# Patient Record
Sex: Male | Born: 1990 | Race: White | Hispanic: No | Marital: Single | Smoking: Former smoker
Health system: Southern US, Community
[De-identification: ages and names within clinical notes are randomized; demographics above are authoritative.]

## PROBLEM LIST (undated history)

## (undated) DIAGNOSIS — F32A Depression, unspecified: Secondary | ICD-10-CM

## (undated) DIAGNOSIS — F329 Major depressive disorder, single episode, unspecified: Secondary | ICD-10-CM

## (undated) DIAGNOSIS — F988 Other specified behavioral and emotional disorders with onset usually occurring in childhood and adolescence: Secondary | ICD-10-CM

## (undated) DIAGNOSIS — L659 Nonscarring hair loss, unspecified: Secondary | ICD-10-CM

## (undated) DIAGNOSIS — F410 Panic disorder [episodic paroxysmal anxiety] without agoraphobia: Secondary | ICD-10-CM

## (undated) DIAGNOSIS — G8929 Other chronic pain: Secondary | ICD-10-CM

## (undated) DIAGNOSIS — F419 Anxiety disorder, unspecified: Secondary | ICD-10-CM

## (undated) DIAGNOSIS — R079 Chest pain, unspecified: Secondary | ICD-10-CM

---

## 2009-04-27 ENCOUNTER — Emergency Department: Payer: Self-pay | Admitting: Emergency Medicine

## 2016-04-04 ENCOUNTER — Encounter (HOSPITAL_COMMUNITY): Payer: Self-pay | Admitting: Licensed Clinical Social Worker

## 2016-04-04 ENCOUNTER — Ambulatory Visit (INDEPENDENT_AMBULATORY_CARE_PROVIDER_SITE_OTHER): Payer: 59 | Admitting: Licensed Clinical Social Worker

## 2016-04-04 ENCOUNTER — Encounter (INDEPENDENT_AMBULATORY_CARE_PROVIDER_SITE_OTHER): Payer: Self-pay

## 2016-04-04 DIAGNOSIS — F332 Major depressive disorder, recurrent severe without psychotic features: Secondary | ICD-10-CM | POA: Diagnosis not present
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
## 2016-04-11 ENCOUNTER — Other Ambulatory Visit (HOSPITAL_COMMUNITY): Payer: 59 | Attending: Psychiatry | Admitting: Licensed Clinical Social Worker

## 2016-04-11 DIAGNOSIS — F332 Major depressive disorder, recurrent severe without psychotic features: Secondary | ICD-10-CM | POA: Diagnosis not present

## 2016-04-12 ENCOUNTER — Other Ambulatory Visit (HOSPITAL_COMMUNITY): Payer: 59 | Admitting: Licensed Clinical Social Worker

## 2016-04-12 ENCOUNTER — Other Ambulatory Visit (HOSPITAL_COMMUNITY): Payer: 59 | Attending: Psychiatry | Admitting: Licensed Clinical Social Worker

## 2016-04-12 ENCOUNTER — Other Ambulatory Visit (HOSPITAL_COMMUNITY): Payer: 59 | Admitting: Psychiatry

## 2016-04-12 ENCOUNTER — Encounter (HOSPITAL_COMMUNITY): Payer: Self-pay | Admitting: Occupational Therapy

## 2016-04-12 DIAGNOSIS — F332 Major depressive disorder, recurrent severe without psychotic features: Secondary | ICD-10-CM | POA: Diagnosis not present
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
## 2016-04-12 NOTE — Psych (Signed)
   [facility]  Logan EaringJohn Godsey [id number]  Session Time: 9 AM - 2 PM  Participation Level: Active  Behavioral Response: CasualAlertDepressed  Type of Therapy: Group Therapy  Treatment Goals addressed: Coping; Depression  Interventions: CBT, DBT, Supportive and Reframing  Summary: Clinician facilitated check-in regarding current stressors and situation, and review of patient completed diary card. Clinician utilized active listening and empathetic response and validated patient emotions. Clinician facilitated discussion on racing thoughts, control, radical acceptance, and how to utilize skills. Clinician introduced topic of communication and provided psycho-education on non-verbal cues and the 4 types of communication styles. Clinician assessed for immediate needs, medication compliance and efficacy, and safety concerns.   Suicidal/Homicidal: Nowithout intent/plan  Therapist Response: Logan Juarez is a 25 y.o. male who presents with depression symptoms. Patient arrived within time allowed and reports he is feeling "okay." Patient rates his mood at a 6 on a 1- 10 scale with 10 being great.  Patient engaged in group activity and discussion. Patient reported understanding of topics discussed. Patient demonstrates some progress as evidenced by openness and some insight. This is patient's first day of group. Patient denies SI/HI/self-harm thoughts.    Plan: Patient will continue in PHP and medication management. Work towards decreasing depression symptoms and increase emotional regulation and positive coping skills.    Diagnosis: Severe episode of recurrent major depressive disorder, without psychotic features (HCC) [F33.2]    1. Severe episode of recurrent major depressive disorder, without psychotic features (HCC)       Donia GuilesJenny Arora Coakley, LCSW 04/12/2016

## 2016-04-13 ENCOUNTER — Encounter (HOSPITAL_COMMUNITY): Payer: Self-pay | Admitting: Psychiatry

## 2016-04-13 ENCOUNTER — Other Ambulatory Visit (HOSPITAL_COMMUNITY): Payer: Self-pay

## 2016-04-13 MED ORDER — CITALOPRAM HYDROBROMIDE 10 MG PO TABS: 10.0000 mg | ORAL_TABLET | Freq: Every day | ORAL | 0 refills | Status: DC

## 2016-04-13 NOTE — Psych (Signed)
   [facility]  Logan Juarez [id number]  Session Time: 9 AM - 2 PM  Participation Level: Active  Behavioral Response: CasualAlertDepressed  Type of Therapy: Group Therapy  Treatment Goals addressed: Coping; Depression  Interventions: CBT, DBT, Supportive and Reframing  Summary: Clinician facilitated check-in regarding current stressors and situation, and review of patient completed diary card. Clinician utilized active listening and empathetic response and validated patient emotions. Clinician facilitated discussion on the way that our past effects our present, forgiveness, and mindfulness. Clinician reviewed topic of communication and introduced "I Statements" and how to utilize them to increase effective communication.. Clinician assessed for immediate needs, medication compliance and efficacy, and safety concerns.   Suicidal/Homicidal: Nowithout intent/plan  Therapist Response: Logan EaringJohn Mccallum is a 25 y.o. male who presents with depression symptoms. Patient arrived within time allowed and reports he is feeling "fine." Patient rates his mood at a 6 on a 1- 10 scale with 10 being great.  Patient engaged in group activity and discussion. Patient reports understanding of topics discussed however showed some resistance to the effectiveness of using the skill. Patient demonstrates some progress as evidenced by openness and engagement. Patient denies SI/HI/self-harm thoughts.    Plan: Patient will continue in PHP and medication management. Work towards decreasing depression symptoms and increase emotional regulation and positive coping skills.    Diagnosis: Severe episode of recurrent major depressive disorder, without psychotic features (HCC) [F33.2]    1. Severe episode of recurrent major depressive disorder, without psychotic features (HCC)       Donia GuilesJenny Dearion Huot, LCSW 04/13/2016
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
## 2016-04-14 ENCOUNTER — Other Ambulatory Visit (HOSPITAL_COMMUNITY): Payer: 59 | Admitting: Occupational Therapy

## 2016-04-14 ENCOUNTER — Encounter (HOSPITAL_COMMUNITY): Payer: Self-pay | Admitting: Occupational Therapy

## 2016-04-14 ENCOUNTER — Other Ambulatory Visit (HOSPITAL_COMMUNITY): Payer: 59 | Attending: Psychiatry | Admitting: Licensed Clinical Social Worker

## 2016-04-14 DIAGNOSIS — F329 Major depressive disorder, single episode, unspecified: Secondary | ICD-10-CM | POA: Insufficient documentation

## 2016-04-14 DIAGNOSIS — F332 Major depressive disorder, recurrent severe without psychotic features: Secondary | ICD-10-CM

## 2016-04-14 DIAGNOSIS — F410 Panic disorder [episodic paroxysmal anxiety] without agoraphobia: Secondary | ICD-10-CM | POA: Diagnosis not present

## 2016-04-14 NOTE — Therapy (Signed)
[facility] [address] [place], [place], [address] Phone: [phone]   Fax:  [phone]  Occupational Therapy Treatment  Patient Details  Name: Logan Juarez MRN: [id number] Date of Birth: May 21, 1991 Referring Provider: Dr. Nehemiah MassedFernando Cobos  Encounter Date: 04/14/2016      OT End of Session - 04/14/16 1712    Visit Number 2   Number of Visits 6   Date for OT Re-Evaluation 05/03/16   Authorization Type BCBS   OT Start Time 1030   OT Stop Time 1130   OT Time Calculation (min) 60 min   Activity Tolerance Patient tolerated treatment well   Behavior During Therapy [facility] for tasks assessed/performed      History reviewed. No pertinent past medical history.  No past surgical history on file.  There were no vitals filed for this visit.      Subjective Assessment - 04/14/16 1712    Currently in Pain? No/denies            [facility] - 04/14/16 1711      Assessment   Diagnosis Depression and anxiety     Precautions   Precautions None      OT Group: Social and Communication Skills   S:  "I feel like I have pretty good communication skills." O:  Patient actively participated in the following skilled occupational therapy treatment session this date:             Social and communication skills: Rohith participated in discussion of social and communication skills including the importance of social skills, what he is good at and what he needs to improve on. Fisher engaged in discussion on conversation starters identifying more than 5  potential ideas for beginning a conversation. Also engaged in discussion on body language and  what is portrayed via various gestures and postures. Montrey completed self-esteem alphabet  worksheet identifying good qualities about himself. Also discussed variable that affect self-esteem. Finally engaged in discussion of assertiveness including confident behavior and reviewed the traits of  passive, assertive, and aggressive individuals.  A:  Patient participated in skilled occupational therapy group for social and communication skills this date.  Patient was engaged and open to ideas and strategies introduced.  Pt feels confident in his communication skills, however admits to very low self-esteem and minimal desire to improve his communication skills for future interactions.   P:  Continue participation in skilled occupational therapy groups  1-2 times per week for 2 weeks in order to gain the necessary skills needed to return to full time community living and learn effective coping strategies to be a productive community resident. Follow up on HEP social and communication skills.           OT Short Term Goals - 04/14/16 1713      OT SHORT TERM GOAL #1   Title Patient will be educated on strategies to improve psychosocial skills needed to participate fully in all daily, work, and leisure activities.   Time 3   Period Weeks   Status On-going     OT SHORT TERM GOAL #2   Title Patient will be educated on a HEP and independent with implementation of HEP.   Time 3   Period Weeks   Status On-going     OT SHORT TERM GOAL #3   Title Patient will independently apply psychosocial skills and coping mechanisms to her daily activities in order to function independently   Time 3   Period  Weeks   Status On-going                  Plan - 04/14/16 1712    Rehab Potential Good   OT Frequency --  1-2x/week   OT Duration --  3 weeks   OT Treatment/Interventions Self-care/ADL training  psychosocial skill development, coping skills mechanism training, community reintegration   Consulted and Agree with Plan of Care Patient      Patient will benefit from skilled therapeutic intervention in order to improve the following deficits and impairments:   (psychosocial development, coping skills development)  Visit Diagnosis: Severe episode of recurrent major depressive  disorder, without psychotic features [facility](HCC)    Problem List Patient Active Problem List   Diagnosis Date Noted  . Severe recurrent major depression without psychotic features [facility](HCC) 04/04/2016   Ezra SitesLeslie Tyrus Wilms, OTR/L  [phone] 04/14/2016, 5:13 PM  [facility] [address] [place], [place], [address] Phone: [phone]   Fax:  [phone]  Name: Logan Juarez MRN: [id number] Date of Birth: 1991-05-18

## 2016-04-15 ENCOUNTER — Other Ambulatory Visit (HOSPITAL_COMMUNITY): Payer: Self-pay

## 2016-04-15 NOTE — Psych (Signed)
   [facility] THERAPIST PROGRESS NOTE  Logan Juarez [id number]  Session Time: 9 AM - 2 PM  Participation Level: Active  Behavioral Response: CasualAlertDepressed  Type of Therapy: Group Therapy  Treatment Goals addressed: Coping; Depression  Interventions: CBT, DBT, Supportive and Reframing  Summary: Clinician facilitated check-in regarding current stressors and situation, and review of patient completed diary card. Clinician utilized active listening and empathetic response and validated patient emotions. Clinician facilitated discussion on goal setting and skilfulness. Clinician reviewed topic of communication and introduced active listening skills. Clinician assessed for immediate needs, medication compliance and efficacy, and safety concerns.   Suicidal/Homicidal: Nowithout intent/plan  Therapist Response: Logan Juarez is a 25 y.o. male who presents with depression symptoms. Patient arrived within time allowed and reports he is feeling "good." Patient rates his mood at a 7 on a 1- 10 scale with 10 being great.  Patient engaged in group activity and discussion. Patient participated in Designer, industrial/productskill practice and role plays. Patient demonstrates some progress as evidenced by identifying ways in which his patterns have caused problems in his life. Patient denies SI/HI/self-harm thoughts.    Plan: Patient will continue in PHP and medication management. Work towards decreasing depression symptoms and increase emotional regulation and positive coping skills.    Diagnosis: Severe episode of recurrent major depressive disorder, without psychotic features (HCC) [F33.2]    1. Severe episode of recurrent major depressive disorder, without psychotic features (HCC)       Donia GuilesJenny Quanesha Klimaszewski, LCSW 04/15/2016

## 2016-04-18 ENCOUNTER — Encounter (HOSPITAL_COMMUNITY): Payer: Self-pay | Admitting: Psychiatry

## 2016-04-18 ENCOUNTER — Other Ambulatory Visit (HOSPITAL_COMMUNITY): Payer: Self-pay | Admitting: Psychiatry

## 2016-04-18 ENCOUNTER — Other Ambulatory Visit (HOSPITAL_COMMUNITY): Payer: 59 | Admitting: Licensed Clinical Social Worker

## 2016-04-18 DIAGNOSIS — F329 Major depressive disorder, single episode, unspecified: Secondary | ICD-10-CM | POA: Diagnosis not present

## 2016-04-18 DIAGNOSIS — F331 Major depressive disorder, recurrent, moderate: Secondary | ICD-10-CM

## 2016-04-18 NOTE — Progress Notes (Signed)
04/18/16  Psychiatry Progress Note  Duration- 20 minutes   Subjective-patient reports he feels she is doing "OK", and that he has improved. States he has been able to use some of the coping skills he learned and that they have been helpful. At this time minimizes depression, and reports anxiety symptoms, which had been severe, are now improving . Of note, states he has decided not to take prescribed medications (  Celexa) because he feels he has been doing much better with therapy, and does not currently feel he needs to add a medication to treatment .  Denies any further episodes of drinking , states he has thoughts of drinking at times, but denies cravings.    Objective- Patient presents improved compared to admission - presents with improving mood and a fuller range of affect . At this time denies any severe  neuro-vegetative symptoms of depression, denies any suicidal ideations , and states that the initial sense of "shock ", and emotional numbness he had experienced after recent losses ( mainly break up with GF) has resolved and he feels he is better able now to focus on his future rather than ruminate about past .  With regards to this states he had a job interview last week and is hoping to get job- feels he did well on interview, and is excited about the prospect of a job offer. As noted, states he decided not to take prescribed CELEXA , because he does not feel he needs it at this time . Feels he is benefiting from coping strategies and tools he has learnt in PHP.   ROS-denies headache, chest pain, shortness of breath, nausea or vomiting .   Current Medications- not currently on any standing medications    MSE- alert , attentive, wellgroomed, good eye contact,speech normal in tone, volume, rate, mood is improved and today presents euthymic,with  full range of  affect, no thought disorder, no suicidal or homicidal ideations,no hallucinations,  no delusions are  expressed, does not appear internally preoccupied . At this time future oriented .Judgement and Insight intact . Fully oriented x 3.  Assessment- patient presents  improved compared to initial presentation-  mood and affect appear improved, and today presents euthymic, with a full range of affect. Reports improved anxiety as well . Today less ruminative about recent losses and more focused on recent job interview and hoping he will be offered a job. Reports feeling optimistic. Has decided not to take any antidepressant medication at this time , as feels he has made significant improvement without need for medication.     Dx-   MDD, single episode, no psychotic features, Panic Disorder without Agoraphobia, Alcohol Abuse   Plan-Continue  PHP , focusing on coping skills and strategies to address stressors   D/C Celexa, as has decided not to take it. On no psychiatric medications at this time .   Nehemiah MassedFernando Cobos , MD

## 2016-04-19 ENCOUNTER — Other Ambulatory Visit (HOSPITAL_COMMUNITY): Payer: 59 | Admitting: Licensed Clinical Social Worker

## 2016-04-19 ENCOUNTER — Other Ambulatory Visit (HOSPITAL_COMMUNITY): Payer: 59 | Admitting: Occupational Therapy

## 2016-04-19 ENCOUNTER — Encounter (HOSPITAL_COMMUNITY): Payer: Self-pay

## 2016-04-19 VITALS — BP 104/68 | HR 60 | Ht 68.25 in | Wt 136.2 lb

## 2016-04-19 DIAGNOSIS — F329 Major depressive disorder, single episode, unspecified: Secondary | ICD-10-CM | POA: Diagnosis not present

## 2016-04-19 DIAGNOSIS — F331 Major depressive disorder, recurrent, moderate: Secondary | ICD-10-CM

## 2016-04-19 NOTE — Progress Notes (Signed)
Patient presented with appropriate affect, level mood and reported feeling as if PHP really being effective at this time.  States past history with individual therapy and states he is learning more skills currently than has ever been exposed to in the past and also helps that he is with patients of the same age range and issues.  Patient denied any current suicidal or homicidal ideations and no plan or intent to harm self or others.  Patient reported he had hurt his right knee years back and several months back re-injured what he felt was a meniscus tear.  States he recently had pain but wore a knee brace and was now better with minimal continued discomfort.  States he will follow up with PCP or orthopedist if needed.  Reports his depression a 2, anxiety a 0 and hopelessness a 0 on a scale of 0-10 with 0 being none and 10 the worst he could be exposed.  States PHP has been an extremely effective treatment and wants to finish program to continue improvements and lessening of depressive symptoms.  Patient with no complaints today and will continue to [facility] as needed.

## 2016-04-20 ENCOUNTER — Other Ambulatory Visit (HOSPITAL_COMMUNITY): Payer: 59 | Admitting: Licensed Clinical Social Worker

## 2016-04-20 ENCOUNTER — Encounter (HOSPITAL_COMMUNITY): Payer: Self-pay | Admitting: Occupational Therapy

## 2016-04-20 DIAGNOSIS — F332 Major depressive disorder, recurrent severe without psychotic features: Secondary | ICD-10-CM

## 2016-04-20 DIAGNOSIS — F329 Major depressive disorder, single episode, unspecified: Secondary | ICD-10-CM | POA: Diagnosis not present

## 2016-04-20 NOTE — Therapy (Signed)
[facility] [address] [place], [place], [address] Phone: [phone]   Fax:  [phone]  Occupational Therapy Treatment  Patient Details  Name: Logan Juarez MRN: [id number] Date of Birth: December 12, 1990 Referring Provider: Dr. Nehemiah MassedFernando Cobos  Encounter Date: 04/19/2016      OT End of Session - 04/20/16 0804    Visit Number 3   Number of Visits 6   Date for OT Re-Evaluation 05/03/16   Authorization Type BCBS   OT Start Time 1030   OT Stop Time 1130   OT Time Calculation (min) 60 min   Activity Tolerance Patient tolerated treatment well   Behavior During Therapy [facility] for tasks assessed/performed      History reviewed. No pertinent past medical history.  No past surgical history on file.  There were no vitals filed for this visit.      Subjective Assessment - 04/20/16 0804    Currently in Pain? No/denies            [facility] Assessment - 04/20/16 0804      Assessment   Diagnosis Depression and anxiety     Precautions   Precautions None       OT Group: Job Readiness   S:  "I just interviewed for my dream job." O:  Patient actively participated in the following skilled occupational therapy treatment session this date:             Job readiness-Discussed the skills necessary for job readiness including hard and soft   Skills. Discussed the difference in each skill set, Garlan participated in group discussion of what constitutes each skill and why they are important. Moises completed the My Ideal Job worksheet and shared findings with the group. He also completed the Draw My Wall worksheet identifying various elements as preventing him from achieving a job or dream. OT, Phoenyx, and group discussed obstacles to goal achievement including schooling, indecisiveness, and financial barriers.  A:  Patient participated in skilled occupational therapy group for job readiness this date.  Patient was engaged and open to discussion  and strategies introduced. Pt provided with The Do's and Don'ts of Keeping a Job handout and the [employer] Job Readiness Skills Outline. Seanmichael completed homework contract sheet identifying his current strength for job readiness, what he wants to improve, and a goal for achieving his future career.   P:  Continue participation in skilled occupational therapy groups  1-2 times per week for 2 weeks in order to gain the necessary skills needed to return to full time community living and learn effective coping strategies to be a productive community resident. Follow up on HEP for job readiness.              OT Short Term Goals - 04/14/16 1713      OT SHORT TERM GOAL #1   Title Patient will be educated on strategies to improve psychosocial skills needed to participate fully in all daily, work, and leisure activities.   Time 3   Period Weeks   Status On-going     OT SHORT TERM GOAL #2   Title Patient will be educated on a HEP and independent with implementation of HEP.   Time 3   Period Weeks   Status On-going     OT SHORT TERM GOAL #3   Title Patient will independently apply psychosocial skills and coping mechanisms to her daily activities in order to function independently   Time 3   Period Weeks  Status On-going                  Plan - 04/20/16 0805    Rehab Potential Good   OT Frequency --  1-2x/week   OT Duration --  3 weeks   OT Treatment/Interventions Self-care/ADL training  coping skills mechanism training, psychosocial skill development, community reintegration   Consulted and Agree with Plan of Care Patient      Patient will benefit from skilled therapeutic intervention in order to improve the following deficits and impairments:   (coping skills development, psychosocial skill development)  Visit Diagnosis: MDD (major depressive disorder), recurrent episode, moderate (HCC)    Problem List Patient Active Problem List   Diagnosis Date Noted  . Severe  recurrent major depression without psychotic features [facility](HCC) 04/04/2016   Ezra SitesLeslie Ngozi Alvidrez, OTR/L  [phone] 04/20/2016, 8:06 AM  [facility] [address] [place], [place], [address] Phone: [phone]   Fax:  [phone]  Name: Logan Juarez MRN: [id number] Date of Birth: July 17, 1990

## 2016-04-21 ENCOUNTER — Other Ambulatory Visit (HOSPITAL_COMMUNITY): Payer: 59 | Admitting: Licensed Clinical Social Worker

## 2016-04-21 ENCOUNTER — Other Ambulatory Visit (HOSPITAL_COMMUNITY): Payer: 59 | Admitting: Specialist

## 2016-04-21 DIAGNOSIS — F332 Major depressive disorder, recurrent severe without psychotic features: Secondary | ICD-10-CM

## 2016-04-21 DIAGNOSIS — F331 Major depressive disorder, recurrent, moderate: Secondary | ICD-10-CM

## 2016-04-21 DIAGNOSIS — F329 Major depressive disorder, single episode, unspecified: Secondary | ICD-10-CM | POA: Diagnosis not present

## 2016-04-21 NOTE — Psych (Signed)
   [facility]  Logan EaringJohn Juarez [id number]  Session Time: 9 AM - 2 PM  Participation Level: Active  Behavioral Response: CasualAlertDepressed  Type of Therapy: Group Therapy  Treatment Goals addressed: Coping; Depression  Interventions: CBT, DBT, Supportive and Reframing  Summary: Clinician facilitated check-in regarding current stressors and situation, and review of patient completed diary card. Clinician utilized active listening and empathetic response and validated patient emotions. Patient discussed progress and identified specific topics he wanted to work on for the day.  Clinician allowed patients to discussed pertinent topics during a process session. Patient stated he did not have anything personal to discuss, and appropriately responded to other group members topics. Group discussed job skills with occupational therapist. Patient stated it was "helpful" with interview techniques. Group discussed mindfulness. [name]'s "Don't try to be mindful," TedTalk was utilized. Group discussed the "What" and "How" skills of being mindful. Patient shared how mindfulness has helped him to start feeling better.  Clinician assessed for immediate needs, medication compliance and efficacy, and safety concerns. Patient saw nurse.    Suicidal/Homicidal: Nowithout intent/plan  Therapist Response: Logan EaringJohn Sesma is a 25 y.o. male who presents with depression symptoms.Patient presented within time allowed and in a "great" mood. Patient rated himself at an "7" out of 10 at the beginning of group due to him feeling better and more hopeful. Patient rated himself at an "8" at the end of group, stating he feels like he is getting back to himself. Patient reports "a little" anger, and no feelings of embarrassment, sadness, happiness, or worry over the past 24 hours.  Patient engaged in group activity and discussion. Patient provided specific ways mindfulness has been and can continue to be  helpful for him.  Patient demonstrates continued progress as evidenced by continued participation in group and self-report of increased practice of skills learned within group and decrease in anxiety and depression symptoms. Patient denies any immediate needs.     Plan: Patient will continue in PHP and medication management. Work towards decreasing depression symptoms and increase emotional regulation and positive coping skills.    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 MDD (major depressive disorder), recurrent episode, moderate (HCC)       Donia GuilesJenny Dezaray Shibuya, LCSW 04/21/2016

## 2016-04-21 NOTE — Psych (Signed)
   [facility]  Logan EaringJohn Juarez [id number]  Session Time: 9 AM - 2 PM  Participation Level: Active  Behavioral Response: CasualAlertDepressed  Type of Therapy: Group Therapy  Treatment Goals addressed: Coping; Depression  Interventions: CBT, DBT, Supportive and Reframing  Summary: Clinician facilitated check-in regarding current stressors and situation, and review of patient completed diary card. Clinician utilized active listening and empathetic response and validated patient emotions. Patient discussed progress and identified specific topics he wanted to work on for the day.  Clinician led group in a mindfulness activity. Clinician allowed patients to discussed pertinent topics during a process session. Patient discussed continuing to feel better and how group has helped. Group discussed Positive Psychology. Clinician had clients complete a "What am I Grateful For" sheet and discussed as a group. Group watched Shaun Achor's "The Positive Advantage" Ted-Talk. Group discussed information and ideas from the Ted-Talk. Clinician led group in practicing the five activities to help lead to thinking positively discussed in the video. Patient identified exercise as a good activity for him. Clinician challenged patient to add two minutes of meditation into his daily routine.  Clinician assessed for immediate needs, medication compliance and efficacy, and safety concerns. Patient saw doctor.    Suicidal/Homicidal: Nowithout intent/plan  Therapist Response: Logan Juarez is a 25 y.o. male who presents with depression symptoms. Patient presented within time allowed and in a "great" mood. Patient rated himself at an "8" out of 10 at the beginning of group due to him feeling better and more hopeful. Patient rated himself at an "8.5" at the end of group, stating he still feels optimistic and hopeful. Patient reports "a little" anger, and no feelings of embarrassment, sadness, happiness, or  worry over the past 24 hours.  Patient engaged in group activity and discussion. Patient provided specific ways positive psychology, and thinking positively, could help him.  Patient demonstrates continued progress as evidenced by continued participation in group and self-report of increased practice of skills learned within group and decrease in anxiety and depression symptoms. Patient denies any immediate needs.    Plan: Patient will continue in PHP and medication management. Work towards decreasing depression symptoms and increase emotional regulation and positive coping skills.    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 MDD (major depressive disorder), recurrent episode, moderate (HCC)       Donia GuilesJenny Uri Covey, LCSW 04/21/2016

## 2016-04-21 NOTE — Psych (Signed)
   [facility]  Logan EaringJohn Juarez [id number]  Session Time: 9 AM - 2 PM  Participation Level: Active  Behavioral Response: CasualAlertDepressed  Type of Therapy: Group Therapy  Treatment Goals addressed: Coping; Depression  Interventions: CBT, DBT, Supportive and Reframing  Summary: . Clinician facilitated check-in regarding current stressors and situation, and review of patient completed diary card. Clinician utilized active listening and empathetic response and validated patient emotions. Patient discussed progress and identified specific topics he wanted to work on for the day.  Clinician led group in mindfulness activity utilizing glitter bottles. Clinician allowed patients to discussed pertinent topics during a process session. Patient discussed communicating with the friend she was experiencing conflict with the day before. Group discussed distress tolerance skills including "STOP," "ACCEPTS," "TIPS" and self-soothing. Patient identified specific aspects of the skills he would utilize in his life.  Clinician assessed for immediate needs, medication compliance and efficacy, and safety concerns.    Suicidal/Homicidal: Nowithout intent/plan  Therapist Response: Logan EaringJohn Juarez is a 25 y.o. male who presents with depression symptoms. Patient presented within time allowed and in a "great" mood. Patient rated himself at an "8" out of 10 at the beginning of group due to him feeling better and more hopeful. Patient rated himself at an "9" at the end of group, stating he thinks the skills he learned could be useful. Patient reports "a little" happiness, and no feelings of embarrassment, sadness, happiness, or worry over the past 24 hours.  Patient engaged in group activity and discussion. Patient provided specific ways distress tolerance skills can be helpful for him.  Patient demonstrates continued progress as evidenced by continued participation in group and self-report of increased  practice of skills learned within group and decrease in anxiety and depression symptoms. Patient denies any immediate needs.    Plan: Patient will continue in PHP and medication management. Work towards decreasing depression symptoms and increase emotional regulation and positive coping skills.    Diagnosis: Severe episode of recurrent major depressive disorder, without psychotic features (HCC) [F33.2]    1. Severe episode of recurrent major depressive disorder, without psychotic features (HCC)       Donia GuilesJenny Demontrae Gilbert, LCSW 04/21/2016

## 2016-04-21 NOTE — Therapy (Signed)
[facility] [address] [place], [place], [address] Phone: [phone]   Fax:  [phone]  Occupational Therapy Treatment  Patient Details  Name: Logan EaringJohn Juarez MRN: [id number] Date of Birth: [birth year] Referring Provider: Dr. Nehemiah MassedFernando Cobos  Encounter Date: 04/21/2016      OT End of Session - 04/21/16 1329    Visit Number 4   Number of Visits 6   Date for OT Re-Evaluation 05/03/16   Authorization Type BCBS   OT Start Time 1030   OT Stop Time 1120   OT Time Calculation (min) 50 min   Activity Tolerance Patient tolerated treatment well   Behavior During Therapy [facility] for tasks assessed/performed      No past medical history on file.  No past surgical history on file.  There were no vitals filed for this visit.      Subjective Assessment - 04/21/16 1328    Currently in Pain? No/denies            [facility] - 04/20/16 0804      Assessment   Diagnosis Depression and anxiety     Precautions   Precautions None     S:  I got a job offer.  I really like backpacking for exercise. O:  Patient actively participated in the following skilled occupational therapy group this date: o Exercise and healthy eating - benefits and contraindications to exercise, how to begin exercise, chair yoga and livingroom cardio/strengthening.  Kemper reports enjoying backpacking for exercise, and doesn't complete other exercise throughout the week.  Brainstorming with client, he arose at decision that a walk in the woods after work would be enjoyable, stress reliever, mood booster and similar to backpacking.  Discussed modification of exercises for his knee injury and patient able to modify independently. Patient remained focused and engaged in group throughout the entire session.. A:  Patient participated in skilled occupational therapy group for exercise skills this date.  Patient was engaged. P:  Continue participation in skilled  occupational therapy groups  1-2 times per week for 1 weeks in order to gain the necessary skills needed to return to full time community living and learn effective coping strategies to be a productive community resident. Next group session to target financial management.                       OT Education - 04/21/16 1328    Education provided Yes   Education Details provided handouts on livingroom cardio/strength workout, chair yoga, get moving local exercise sheet, exercise contraindications, and useful home items to use for exercising   Person(s) Educated Patient   Methods Explanation;Demonstration;Handout   Comprehension Verbalized understanding          OT Short Term Goals - 04/14/16 1713      OT SHORT TERM GOAL #1   Title Patient will be educated on strategies to improve psychosocial skills needed to participate fully in all daily, work, and leisure activities.   Time 3   Period Weeks   Status On-going     OT SHORT TERM GOAL #2   Title Patient will be educated on a HEP and independent with implementation of HEP.   Time 3   Period Weeks   Status On-going     OT SHORT TERM GOAL #3   Title Patient will independently apply psychosocial skills and coping mechanisms to her daily activities in order to function independently   Time 3  Period Weeks   Status On-going                  Plan - 04/20/16 0805    Rehab Potential Good   OT Frequency --  1-2x/week   OT Duration --  3 weeks   OT Treatment/Interventions Self-care/ADL training  coping skills mechanism training, psychosocial skill development, community reintegration   Consulted and Agree with Plan of Care Patient      Patient will benefit from skilled therapeutic intervention in order to improve the following deficits and impairments:   (coping skills development, pyschosocial skills development)  Visit Diagnosis: MDD (major depressive disorder), recurrent episode, moderate  (HCC)    Problem List Patient Active Problem List   Diagnosis Date Noted  . Severe recurrent major depression without psychotic features (HCC) 04/04/2016    Logan MylarBethany H. Jamieka Juarez, MHA, OTR/L [phone]  04/21/2016, 1:30 PM  [facility] [address] [place], [place], [address] Phone: [phone]   Fax:  [phone]  Name: Logan EaringJohn Juarez MRN: [id number] Date of Birth: 04/11/1991

## 2016-04-22 ENCOUNTER — Other Ambulatory Visit (HOSPITAL_COMMUNITY): Payer: 59 | Admitting: Licensed Clinical Social Worker

## 2016-04-22 DIAGNOSIS — F331 Major depressive disorder, recurrent, moderate: Secondary | ICD-10-CM

## 2016-04-22 DIAGNOSIS — F329 Major depressive disorder, single episode, unspecified: Secondary | ICD-10-CM | POA: Diagnosis not present

## 2016-04-22 NOTE — Psych (Signed)
   [facility]  Logan EaringJohn Juarez [id number]  Session Time: 9 AM - 1 PM  Participation Level: Active  Behavioral Response: CasualAlertDepressed  Type of Therapy: Group Therapy  Treatment Goals addressed: Coping; Depression  Interventions: CBT, DBT, Supportive and Reframing  Summary: Clinician facilitated check-in regarding current stressors and situation, and review of patient completed diary card. Clinician utilized active listening and empathetic response and validated patient emotions. Clinician facilitated discussion on managing anxiety, family issues, and future goals. Clinician reviewed topic of mindfulness and how it can be used to manage symptoms. Clinician led mindfulness workshop in which group members practiced mindfulness activities utilizing different senses. Clinician assessed for immediate needs, medication compliance and efficacy, and safety concerns.   Suicidal/Homicidal: Nowithout intent/plan  Therapist Response: Logan EaringJohn Juarez is a 25 y.o. male who presents with depression symptoms. Patient arrived within time allowed and reports he is feeling "good." Patient rates his mood at a 8 on a 1- 10 scale with 10 being great. Patient engaged in activity and discussion. Patient identified mindfulness meditation as a resonant way to utilize mindfulness. Patient demonstrates some progress as evidenced by recognizing times in which he can be skillful and report of practicing skills outside of group. Patient denies SI/HI/self-harm thoughts.    Plan: Patient will continue in PHP and medication management. Work towards decreasing depression symptoms and increase emotional regulation and positive coping skills.    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 MDD (major depressive disorder), recurrent episode, moderate (HCC)       Donia GuilesJenny Akyah Lagrange, LCSW 04/22/2016

## 2016-04-22 NOTE — Psych (Signed)
   [facility]  Logan EaringJohn Juarez [id number]  Session Time: 9 AM - 2 PM  Participation Level: Active  Behavioral Response: CasualAlertDepressed  Type of Therapy: Individual Therapy  Treatment Goals addressed: Coping; Depression  Interventions: CBT, DBT, Supportive and Reframing  Summary: Clinician facilitated check-in regarding current stressors and situation, and review of patient completed diary card. Clinician utilized active listening and empathetic response and validated patient emotions. Clinician facilitated discussion on anger, mindfulness, and the future. Clinician reviewed topic of cognitive distortions and particularly how over-generalizing from past experience can effect the future. Clinician assessed for immediate needs, medication compliance and efficacy, and safety concerns.   Suicidal/Homicidal: Nowithout intent/plan  Therapist Response: Logan EaringJohn Templin is a 25 y.o. male who presents with depression symptoms. Patient arrived within time allowed and reports he is feeling "okay." Patient rates his mood at a 7 on a 1- 10 scale with 10 being great. Patient shares he was offered and accepted a job in his field. Patient engaged in activity and discussion. Patient demonstrates some progress as evidenced by brightened affect and increased ability to recognize unhealthy thought patterns and acknowledgment of skills to assuage his symptoms. Patient struggles withPatient denies SI/HI/self-harm thoughts.    Plan: Patient will continue in PHP and medication management. Work towards decreasing depression symptoms and increase emotional regulation and positive coping skills.    Diagnosis: Severe episode of recurrent major depressive disorder, without psychotic features (HCC) [F33.2]    1. Severe episode of recurrent major depressive disorder, without psychotic features (HCC)       Donia GuilesJenny Kirklin Mcduffee, LCSW 04/22/2016

## 2016-04-25 ENCOUNTER — Other Ambulatory Visit (HOSPITAL_COMMUNITY): Payer: Self-pay

## 2016-04-26 ENCOUNTER — Other Ambulatory Visit (HOSPITAL_COMMUNITY): Payer: 59 | Admitting: Licensed Clinical Social Worker

## 2016-04-26 ENCOUNTER — Other Ambulatory Visit (HOSPITAL_COMMUNITY): Payer: 59 | Admitting: Occupational Therapy

## 2016-04-26 ENCOUNTER — Encounter (HOSPITAL_COMMUNITY): Payer: Self-pay | Admitting: Occupational Therapy

## 2016-04-26 DIAGNOSIS — F332 Major depressive disorder, recurrent severe without psychotic features: Secondary | ICD-10-CM

## 2016-04-26 DIAGNOSIS — F331 Major depressive disorder, recurrent, moderate: Secondary | ICD-10-CM

## 2016-04-26 DIAGNOSIS — F329 Major depressive disorder, single episode, unspecified: Secondary | ICD-10-CM | POA: Diagnosis not present

## 2016-04-26 NOTE — Therapy (Signed)
[facility] [address] [place] [place], [place], [address] Phone: [phone]   Fax:  [phone]  Occupational Therapy Treatment  Patient Details  Name: Allena EaringJohn Mangiapane MRN: [id number] Date of Birth: 12-14-1990 Referring Provider: Dr. Nehemiah MassedFernando Cobos  Encounter Date: 04/26/2016      OT End of Session - 04/26/16 1618    Visit Number 5   Number of Visits 6   Date for OT Re-Evaluation 05/03/16   Authorization Type BCBS   OT Start Time 1030   OT Stop Time 1132   OT Time Calculation (min) 62 min   Activity Tolerance Patient tolerated treatment well   Behavior During Therapy [facility] for tasks assessed/performed      History reviewed. No pertinent past medical history.  No past surgical history on file.  There were no vitals filed for this visit.      Subjective Assessment - 04/26/16 1618    Currently in Pain? No/denies            [facility] - 04/26/16 1617      Assessment   Diagnosis Depression and anxiety     Precautions   Precautions None      OT Group: Financial Management   S:  "I don't have money to budget with." O:  Patient actively participated in the following skilled occupational therapy treatment session this date:             Financial management-Discussed the importance of good financial management in all              areas of life. Reggie participated in group discussion regarding current methods of              budgeting and if these are effective or ineffective. He participated in identifying the              "big five" necessities for financial living (food, shelter, insurance, taxes, and              Transportation). Discussed the top ten reasons for not saving, as well as what a money pit is. Greene identified his personal money pit as a card trading game. He participated in group discussion on ways to save and how to develop a budget and spending plan. OT and Jonny RuizJohn brainstormed financial management  techniques to employ including keeping a spending journal, budgeting monthly or weekly, and developing a savings plan.  A:  Patient participated in skilled occupational therapy group for financial management skills this date.  Patient was engaged and open to strategies introduced. Pt provided with spending journal to record spending habits, Air cabin crewinancial Rules for 20 MicrosoftSomethings handout, and a Engineer, building servicesbudget worksheet for college students. Jonavin completed homework contract sheet identifying his current budgeting concerns, what he wants to improve, and a goal for budgeting/saving.  P:  Continue participation in skilled occupational therapy groups 1-2 times per week for 2 weeks in order to gain the necessary skills needed to return to full time community living and learn effective coping strategies to be a productive community resident. Follow up on HEP for developing a budget or savings plan.           OT Short Term Goals - 04/14/16 1713      OT SHORT TERM GOAL #1   Title Patient will be educated on strategies to improve psychosocial skills needed to participate fully in all daily, work, and leisure activities.   Time 3   Period Weeks  Status On-going     OT SHORT TERM GOAL #2   Title Patient will be educated on a HEP and independent with implementation of HEP.   Time 3   Period Weeks   Status On-going     OT SHORT TERM GOAL #3   Title Patient will independently apply psychosocial skills and coping mechanisms to her daily activities in order to function independently   Time 3   Period Weeks   Status On-going                  Plan - 04/26/16 1618    Rehab Potential Good   OT Frequency --  1-2x/week   OT Duration --  3 weeks   OT Treatment/Interventions Self-care/ADL training  coping skills mechanism training, psychosocial skill development, community reintegration   Consulted and Agree with Plan of Care Patient      Patient will benefit from skilled therapeutic intervention in  order to improve the following deficits and impairments:   (coping skills development, psychosocial skills)  Visit Diagnosis: Severe episode of recurrent major depressive disorder, without psychotic features (HCC)    Problem List Patient Active Problem List   Diagnosis Date Noted  . Severe recurrent major depression without psychotic features [facility](HCC) 04/04/2016   Ezra SitesLeslie Rinda Rollyson, OTR/L  [phone] 04/26/2016, 4:19 PM  [facility] [address] [place], [place], [address] Phone: [phone]   Fax:  [phone]  Name: Allena EaringJohn Harrigan MRN: [id number] Date of Birth: 01/23/1991

## 2016-04-27 ENCOUNTER — Other Ambulatory Visit (HOSPITAL_COMMUNITY): Payer: 59 | Admitting: Licensed Clinical Social Worker

## 2016-04-27 DIAGNOSIS — F332 Major depressive disorder, recurrent severe without psychotic features: Secondary | ICD-10-CM

## 2016-04-27 DIAGNOSIS — F329 Major depressive disorder, single episode, unspecified: Secondary | ICD-10-CM | POA: Diagnosis not present

## 2016-04-27 NOTE — Psych (Signed)
   [facility]  Logan EaringJohn Juarez [id number]  Session Time: 9 AM - 1 PM  Participation Level: Active  Behavioral Response: CasualAlertDepressed  Type of Therapy: Group Therapy  Treatment Goals addressed: Coping; Depression  Interventions: CBT, DBT, Supportive and Reframing  Summary: Clinician facilitated check-in regarding current stressors and situation, and review of patient completed diary card. Clinician utilized active listening and empathetic response and validated patient emotions. financial planning and management with occupational therapist. Group discussed difference between feelings and emotions. Group played "feelings Dione PloverJenga" where each member had to explain the emotion drawn and describe a time he felt that way. The game brought about good discussion related to feelings and how everyone defines and experiences them.  Clinician assessed for immediate needs, medication compliance and efficacy, and safety concerns.   Suicidal/Homicidal: Nowithout intent/plan  Therapist Response: Logan EaringJohn Juarez is a 25 y.o. male who presents with depression symptoms. Patient arrived within time allowed and reports he is feeling "good." Patient rates his mood at a 7 on a 1- 10 scale with 10 being great. Patient engaged in group activity and discussion. Patient provided specific instances he experienced specific emotions and provided explanations of emotions drawn during game. Patient demonstrates continued progress as evidenced by continued participation in group and self-report of increased practice of skills learned within group and decrease in anxiety and depression symptoms.  Patient denies SI/HI/self-harm thoughts.    Plan: Patient will continue in PHP and medication management. Work towards decreasing depression symptoms and increase emotional regulation and positive coping skills.    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 MDD  (major depressive disorder), recurrent episode, moderate (HCC)       Donia GuilesJenny Benjimin Hadden, LCSW 04/27/2016

## 2016-04-28 ENCOUNTER — Encounter (HOSPITAL_COMMUNITY): Payer: Self-pay | Admitting: Psychiatry

## 2016-04-28 ENCOUNTER — Other Ambulatory Visit (HOSPITAL_COMMUNITY): Payer: 59 | Admitting: Licensed Clinical Social Worker

## 2016-04-28 ENCOUNTER — Other Ambulatory Visit (HOSPITAL_COMMUNITY): Payer: 59 | Admitting: Specialist

## 2016-04-28 DIAGNOSIS — F329 Major depressive disorder, single episode, unspecified: Secondary | ICD-10-CM | POA: Diagnosis not present

## 2016-04-28 DIAGNOSIS — F332 Major depressive disorder, recurrent severe without psychotic features: Secondary | ICD-10-CM

## 2016-04-28 NOTE — Psych (Signed)
   [facility]  [name] [id number]  Session Time: 9 AM - 2 PM  Participation Level: Active  Behavioral Response: CasualAlertDepressed  Type of Therapy: Group Therapy  Treatment Goals addressed: Coping; Depression  Interventions: CBT, DBT, Supportive and Reframing  Summary: Clinician facilitated check-in regarding current stressors and situation, and review of patient completed diary card. Clinician utilized active listening and empathetic response and validated patient emotions. Clinician facilitated discussion on relationships and self esteem. Clinician allowed patients to discussed pertinent topics during a process session. Clinician introduced cognitive distortions. Patient provided examples of common cognitive distortions she experiences. Clinician and patient discussed methods of combating cognitive distortions including "check the facts" and using the "best friend test." Clinician reviewed Emotional/Rational/Wise mind. Clinician assessed for immediate needs, medication compliance and efficacy, and safety concerns.   Suicidal/Homicidal: Nowithout intent/plan  Therapist Response: [name] is a 25 y.o. male who presents with depression symptoms. Patient arrived within time allowed and reports he is feeling "annoyed." Patient rates his mood at a 5 on a 1- 10 scale with 10 being great, due to a fight with his mom last night and relationship stress.  Patient engaged in group activity and discussion. Patient provided specific examples of ways to combat common cognitive distortions. Patient reports improved mood by end of group rating himself as a 7.  Patient demonstrates progress as evidenced by increased ability to verbalize and manage his emotions as well as decreased depression symptoms. Patient denies SI/HI/self-harm thoughts.    Plan: Patient will continue in PHP and medication management. Work towards decreasing depression symptoms and increase emotional  regulation and positive coping skills.    Diagnosis: Severe episode of recurrent major depressive disorder, without psychotic features (HCC) [F33.2]    1. Severe episode of recurrent major depressive disorder, without psychotic features (HCC)       Donia GuilesJenny Isa Kohlenberg, LCSW 04/28/2016

## 2016-04-28 NOTE — Therapy (Signed)
[facility] [address] [place], [place], [address] Phone: [phone]   Fax:  [phone]  Occupational Therapy Treatment  Patient Details  Name: Logan Juarez MRN: [id number] Date of Birth: 06/06/1991 Referring Provider: Dr. Neita Garnet  Encounter Date: 04/28/2016      OT End of Session - 04/28/16 1518    Visit Number 6   Number of Visits 6   Date for OT Re-Evaluation 05/03/16   Authorization Type BCBS   OT Start Time 1030   OT Stop Time 1130   OT Time Calculation (min) 60 min      No past medical history on file.  No past surgical history on file.  There were no vitals filed for this visit.      Subjective Assessment - 04/28/16 1518    Currently in Pain? No/denies        S:  I used to not plan at all then cram and crash.  I wont be able to do that with my job. O:  Patient actively participated in the following skilled occupational therapy group this date: o Time management - pros and cons of time management, scheduling strategies.   Patient was focused and engaged in group. A:  Patient participated in skilled occupational therapy group for time management skills this date.  Patient wasengaged. P: DC from skilled OT group this date.                           OT Education - 04/28/16 1518    Education provided Yes   Education Details time management strategies   Person(s) Educated Patient   Methods Explanation;Handout   Comprehension Verbalized understanding          OT Short Term Goals - 04/28/16 1519      OT SHORT TERM GOAL #1   Title Patient will be educated on strategies to improve psychosocial skills needed to participate fully in all daily, work, and leisure activities.   Time 3   Period Weeks   Status Achieved     OT SHORT TERM GOAL #2   Title Patient will be educated on a HEP and independent with implementation of HEP.   Time 3   Period Weeks   Status Achieved      OT SHORT TERM GOAL #3   Title Patient will independently apply psychosocial skills and coping mechanisms to her daily activities in order to function independently   Time 3   Period Weeks   Status Achieved                Patient will benefit from skilled therapeutic intervention in order to improve the following deficits and impairments:     Visit Diagnosis: Severe episode of recurrent major depressive disorder, without psychotic features [facility])    Problem List Patient Active Problem List   Diagnosis Date Noted  . Severe recurrent major depression without psychotic features ([place]) 04/04/2016    Vangie Bicker, MHA, OTR/L [phone]   OCCUPATIONAL THERAPY DISCHARGE SUMMARY  Visits from Start of Care: 6  Current functional level related to goals / functional outcomes: Independent with coping strategies.   Remaining deficits: n/a   Education / Equipment: Coping strategies for community reintegration.  Plan: Patient agrees to discharge.  Patient goals were partially met. Patient is being discharged due to meeting the stated rehab goals.  ?????        Vangie Bicker, MHA, OTR/L  [phone]    04/28/2016, 3:19 PM  [facility] [address] [place], [place], [address] Phone: [phone]   Fax:  [phone]  Name: Logan Juarez MRN: [id number] Date of Birth: 1990-06-24

## 2016-04-28 NOTE — Progress Notes (Unsigned)
Patient ID: Logan Juarez, male   DOB: [birth year], 25 y.o.   MRN: [id number] 11/16 /17  Psychiatry Progress Note  Duration- 37mnutes   Subjective-patient reports he is feeling " all right" and reports improved and currently normal mood and decreased anxiety. He states that at this time he feels stable and describes feeling future oriented and optimistic. As noted in prior notes, he states he has decided he does not want to take any psychiatric medication at this time, and has stopped Celexa.   Objective- I have discussed case with Therapist, and have met with patient. As per Therapist (Sonia Baller patient has been doing well, with significant overall improvement, [facility] with a recommendation of continuing individual psychotherapy. Patient presents euthymic, denies depression at this time, denies neuro-vegetative symptoms of depression, describes sleep, appetite, energy level as normal, and denies anhedonia. Denies any suicidal ideations. 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 He starts in two weeks. He states he is no longer focused or ruminative on recent break up and feels he has been able to move on from that loss. Denies any recent drinking or alcohol abuse . Not currently taking any psychiatric medications.    Current Medications- on finasteride , denies side effects, prescribed by PCP. Had been on NSAID PRN for knee pain, but has stopped due to feeling better.   MSE-Alert , attentive, wellgroomed, good eye contact,speech normal in tone, volume, rate, mood is  Euthymic, denies any lingering depression, presents with  full range of affecno thought disorder, no suicidal or homicidal ideations,nohallucinations, no delusions are expressed, not internally preoccupied . At this time future oriented and focused on new job he starts soon.Judgement  and Insight intact .Fully oriented x 3.  Assessment- patient currently stable, euthymic , with bright, reactive affect and no significant neuro-vegetative symptoms of depression or suicidal ideations. Also describes improved anxiety, and does not present anxious , fearful , or restless. He was offered an eEngineer, productionjob and starts soon, is optimistic and excited about this. Not currently on any standing psychiatric medication as he opted not to take an antidepressant medication at this time. Denies any alcohol abuse    Dx-  MDD, single episode, no psychotic features, Panic Disorder without Agoraphobia, Alcohol Abuse   Plan-Completing PHP today, plans to follow up with individual therapy. Not on any psychiatric mediations at this time. Leaving program in good spirits .   FNeita Garnet, MD

## 2016-04-28 NOTE — Psych (Signed)
   [facility] PHP THERAPIST PROGRESS NOTE  Logan Juarez [id number]  Session Time: 9 AM - 1 PM  Participation Level: Active  Behavioral Response: CasualAlertDepressed  Type of Therapy: Group Therapy  Treatment Goals addressed: Coping; Depression  Interventions: CBT, DBT, Supportive and Reframing  Summary: Clinician facilitated check-in regarding current stressors and situation, and review of patient completed diary card. Clinician utilized active listening and empathetic response and validated patient emotions. Clinician facilitated discussion on offering ourselves grace, honoring  feelings, and finding joy. Clinician continued topic of cognitive distortions. Clinician introduced topic of happiness and what it entails. Clinician assessed for immediate needs, medication compliance and efficacy, and safety concerns.   Suicidal/Homicidal: Nowithout intent/plan  Therapist Response: Rachard Isidro is a 25 y.o. male who presents with depression symptoms. Patient arrived within time allowed and reports he is feeling "great." Patient rates his mood at a 8 on a 1- 10 scale with 10 being great. Patient engaged in group activity and discussion. Patient was able to state ways in which to reframe the idea of happiness.  Patient demonstrates progress as evidenced by decreased depression symptoms and increased emotion regulation and distress tolerance ability. Patient denies SI/HI/self-harm thoughts.    Plan: Patient will discharge from [facility] to outpatient therapy. Pt met with psychiatrist who approved discharge. Pt has met goals of decreased depressive symptoms as evidenced by improved PHQ-9 scores, and self-report. Patient has increased ability to manage his symptoms as evidenced by self report in group and improved mood and demeanor. Pt denies SI/HI. Patient accepted appointment at [facility] for 05/16/16 at 10 AM. Pt declined psychiatry services as he is not currently on medication.    Diagnosis: Severe episode of  recurrent major depressive disorder, without psychotic features ([place]) [F33.2]    1. Severe episode of recurrent major depressive disorder, without psychotic features ([place])       Lorin Glass, LCSW 04/28/2016

## 2016-04-29 ENCOUNTER — Other Ambulatory Visit (HOSPITAL_COMMUNITY): Payer: Self-pay

## 2016-05-02 ENCOUNTER — Other Ambulatory Visit (HOSPITAL_COMMUNITY): Payer: Self-pay

## 2016-05-03 ENCOUNTER — Ambulatory Visit (HOSPITAL_COMMUNITY): Payer: Self-pay

## 2016-05-03 ENCOUNTER — Other Ambulatory Visit (HOSPITAL_COMMUNITY): Payer: Self-pay

## 2016-05-04 ENCOUNTER — Other Ambulatory Visit (HOSPITAL_COMMUNITY): Payer: Self-pay

## 2016-05-05 ENCOUNTER — Ambulatory Visit (HOSPITAL_COMMUNITY): Payer: Self-pay

## 2016-05-09 ENCOUNTER — Other Ambulatory Visit (HOSPITAL_COMMUNITY): Payer: Self-pay

## 2016-05-10 ENCOUNTER — Other Ambulatory Visit (HOSPITAL_COMMUNITY): Payer: Self-pay

## 2016-05-10 ENCOUNTER — Other Ambulatory Visit (HOSPITAL_COMMUNITY): Payer: 59

## 2016-05-11 ENCOUNTER — Other Ambulatory Visit (HOSPITAL_COMMUNITY): Payer: Self-pay

## 2016-05-12 ENCOUNTER — Other Ambulatory Visit (HOSPITAL_COMMUNITY): Payer: Self-pay

## 2016-05-12 ENCOUNTER — Ambulatory Visit (HOSPITAL_COMMUNITY): Payer: Self-pay

## 2016-05-13 ENCOUNTER — Other Ambulatory Visit (HOSPITAL_COMMUNITY): Payer: Self-pay

## 2016-05-16 ENCOUNTER — Ambulatory Visit: Payer: Self-pay | Admitting: Licensed Clinical Social Worker

## 2016-05-17 ENCOUNTER — Ambulatory Visit (HOSPITAL_COMMUNITY): Payer: Self-pay

## 2016-05-19 ENCOUNTER — Ambulatory Visit (HOSPITAL_COMMUNITY): Payer: Self-pay

## 2016-05-24 ENCOUNTER — Ambulatory Visit (HOSPITAL_COMMUNITY): Payer: Self-pay

## 2016-05-26 ENCOUNTER — Ambulatory Visit (HOSPITAL_COMMUNITY): Payer: Self-pay

## 2016-07-16 ENCOUNTER — Emergency Department (HOSPITAL_COMMUNITY): Payer: Managed Care, Other (non HMO)

## 2016-07-16 ENCOUNTER — Emergency Department (HOSPITAL_COMMUNITY)
Admission: EM | Admit: 2016-07-16 | Discharge: 2016-07-16 | Disposition: A | Discharge status: Home / Self Care | Payer: Managed Care, Other (non HMO) | Attending: Emergency Medicine | Admitting: Emergency Medicine

## 2016-07-16 ENCOUNTER — Encounter (HOSPITAL_COMMUNITY): Payer: Self-pay | Admitting: Emergency Medicine

## 2016-07-16 DIAGNOSIS — Z79899 Other long term (current) drug therapy: Secondary | ICD-10-CM | POA: Insufficient documentation

## 2016-07-16 DIAGNOSIS — Z87891 Personal history of nicotine dependence: Secondary | ICD-10-CM | POA: Diagnosis not present

## 2016-07-16 DIAGNOSIS — R0789 Other chest pain: Secondary | ICD-10-CM | POA: Diagnosis present

## 2016-07-16 HISTORY — DX: Other chronic pain: G89.29

## 2016-07-16 HISTORY — DX: Panic disorder (episodic paroxysmal anxiety): F41.0

## 2016-07-16 HISTORY — DX: Anxiety disorder, unspecified: F41.9

## 2016-07-16 HISTORY — DX: Depression, unspecified: F32.A

## 2016-07-16 HISTORY — DX: Nonscarring hair loss, unspecified: L65.9

## 2016-07-16 HISTORY — DX: Other specified behavioral and emotional disorders with onset usually occurring in childhood and adolescence: F98.8

## 2016-07-16 HISTORY — DX: Major depressive disorder, single episode, unspecified: F32.9

## 2016-07-16 HISTORY — DX: Chest pain, unspecified: R07.9

## 2016-07-16 LAB — BASIC METABOLIC PANEL
Anion gap: 8 (ref 5–15)
BUN: 16 mg/dL (ref 6–20)
CALCIUM: 9.2 mg/dL (ref 8.9–10.3)
CO2: 29 mmol/L (ref 22–32)
Chloride: 101 mmol/L (ref 101–111)
Creatinine, Ser: 1.01 mg/dL (ref 0.61–1.24)
GFR calc Af Amer: >60 mL/min (ref >60)
Glucose, Bld: 97 mg/dL (ref 65–99)
POTASSIUM: 3.5 mmol/L (ref 3.5–5.1)
SODIUM: 138 mmol/L (ref 135–145)

## 2016-07-16 LAB — TROPONIN I: Troponin I: <0.03 ng/mL (ref <0.03)

## 2016-07-16 LAB — CBC WITH DIFFERENTIAL/PLATELET
BASOS ABS: 0 10*3/uL (ref 0.0–0.1)
BASOS PCT: 0 %
EOS ABS: 0.1 10*3/uL (ref 0.0–0.7)
Eosinophils Relative: 1 %
HEMATOCRIT: 38.8 % — AB (ref 39.0–52.0)
Hemoglobin: 13.6 g/dL (ref 13.0–17.0)
Lymphocytes Relative: 32 %
Lymphs Abs: 2.2 10*3/uL (ref 0.7–4.0)
MCH: 32.5 pg (ref 26.0–34.0)
MCHC: 35.1 g/dL (ref 30.0–36.0)
MCV: 92.8 fL (ref 78.0–100.0)
MONO ABS: 0.9 10*3/uL (ref 0.1–1.0)
Monocytes Relative: 14 %
NEUTROS ABS: 3.7 10*3/uL (ref 1.7–7.7)
Neutrophils Relative %: 53 %
PLATELETS: 261 10*3/uL (ref 150–400)
RBC: 4.18 MIL/uL — ABNORMAL LOW (ref 4.22–5.81)
RDW: 12.2 % (ref 11.5–15.5)
WBC: 6.9 10*3/uL (ref 4.0–10.5)

## 2016-07-16 LAB — D-DIMER, QUANTITATIVE (NOT AT ARMC)

## 2016-07-16 MED ORDER — ACETAMINOPHEN 325 MG PO TABS
650.0000 mg | ORAL_TABLET | Freq: Once | ORAL | Status: AC
  Administered 2016-07-16: 650 mg via ORAL
  Filled 2016-07-16: qty 2

## 2016-07-16 NOTE — ED Provider Notes (Signed)
[facility]-EMERGENCY DEPT Provider Note   CSN: [id number] Arrival date & time: 07/16/16  1635     History   Chief Complaint Chief Complaint  Patient presents with  . Chest Pain    HPI Logan Juarez is a 26 y.o. male.  HPI  Pt was seen at 1640. Per pt, c/o gradual onset and persistence of constant left sided chest "tightness" that began approximately 1 hour ago. Has been associated with fatigue and "dizziness." Symptoms began while he was driving. Pt endorses hx of similar symptoms, dx anxiety and panic attack. Denies palpitations, no cough, no back pain, no abd pain, no N/V/D, no rash, no fevers, no injury.   Past Medical History:  Diagnosis Date  . ADD (attention deficit disorder)   . Anxiety   . Chronic chest pain   . Depression   . Hair loss   . Panic attack     Patient Active Problem List   Diagnosis Date Noted  . Severe recurrent major depression without psychotic features ([facility]) 04/04/2016    History reviewed. No pertinent surgical history.     Home Medications    Prior to Admission medications   Medication Sig Start Date End Date Taking? Authorizing Provider  finasteride (PROSCAR) 5 MG tablet Take 5 mg by mouth daily.  03/24/16  Yes Historical Provider, MD    Family History History reviewed. No pertinent family history.  Social History Social History  Substance Use Topics  . Smoking status: Former Smoker    Years: 2.00  . Smokeless tobacco: Former Neurosurgeon    Types: Snuff    Quit date: 04/15/2016  . Alcohol use No     Allergies   Patient has no known allergies.   Review of Systems Review of Systems ROS: Statement: All systems negative except as marked or noted in the HPI; Constitutional: Negative for fever and chills. ; ; Eyes: Negative for eye pain, redness and discharge. ; ; ENMT: Negative for ear pain, hoarseness, nasal congestion, sinus pressure and sore throat. ; ; Cardiovascular: Negative for palpitations, diaphoresis, dyspnea and peripheral edema. ; ;  Respiratory: Negative for cough, wheezing and stridor. ; ; Gastrointestinal: Negative for nausea, vomiting, diarrhea, abdominal pain, blood in stool, hematemesis, jaundice and rectal bleeding. . ; ; Genitourinary: Negative for dysuria, flank pain and hematuria. ; ; Musculoskeletal: +CP. Negative for back pain and neck pain. Negative for swelling and trauma.; ; Skin: Negative for pruritus, rash, abrasions, blisters, bruising and skin lesion.; ; Neuro: +"Dizziness." Negative for headache, lightheadedness and neck stiffness. Negative for weakness, altered level of consciousness, altered mental status, extremity weakness, paresthesias, involuntary movement, seizure and syncope.      Physical Exam Updated Vital Signs BP 126/60 (BP Location: Left Arm)   Pulse 72   Temp 97.6 F (36.4 C) (Oral)   Resp 20   Ht 5\' 9"  (1.753 m)   Wt 135 lb (61.2 kg)   SpO2 100%   BMI 19.94 kg/m   17:23:12 Orthostatic Vital Signs JS  Orthostatic Lying   BP- Lying: 112/59  Pulse- Lying: 75      Orthostatic Sitting  BP- Sitting: 113/64  Pulse- Sitting: 85      Orthostatic Standing at 0 minutes  BP- Standing at 0 minutes: 114/72  Pulse- Standing at 0 minutes: 68     Physical Exam 1645: Physical examination:  Nursing notes reviewed; Vital signs and O2 SAT reviewed;  Constitutional: Well developed, Well nourished, Well hydrated, In no acute distress; Head:  Normocephalic, atraumatic; Eyes:  EOMI, PERRL, No scleral icterus; ENMT: Mouth and pharynx normal, Mucous membranes moist; Neck: Supple, Full range of motion, No lymphadenopathy; Cardiovascular: Regular rate and rhythm, No gallop; Respiratory: Breath sounds clear & equal bilaterally, No wheezes.  Speaking full sentences with ease, Normal respiratory effort/excursion; Chest: Nontender, Movement normal; Abdomen: Soft, Nontender, Nondistended, Normal bowel sounds; Genitourinary: No CVA tenderness; Extremities: Pulses normal, No tenderness, No edema, No calf edema  or asymmetry.; Neuro: AA&Ox3, Major CN grossly intact.  Speech clear. No gross focal motor or sensory deficits in extremities.; Skin: Color normal, Warm, Dry.; Psych:  Affect flat.    ED Treatments / Results  Labs (all labs ordered are listed, but only abnormal results are displayed)   EKG  EKG Interpretation  Date/Time:  Saturday July 16 2016 16:45:15 EST Ventricular Rate:  68 PR Interval:    QRS Duration: 90 QT Interval:  391 QTC Calculation: 416 R Axis:   90 Text Interpretation:  Sinus rhythm Borderline right axis deviation Baseline wander When compared with ECG of 04/27/2009 No significant change was found Confirmed by [facility]  MD, Nicholos JohnsKATHLEEN [phone]([id number]) on 07/16/2016 4:50:24 PM       Radiology   Procedures Procedures (including critical care time)  Medications Ordered in ED Medications - No data to display   Initial Impression / Assessment and Plan / ED Course  I have reviewed the triage vital signs and the nursing notes.  Pertinent labs & imaging results that were available during my care of the patient were reviewed by me and considered in my medical decision making (see chart for details).  MDM Reviewed: previous chart, nursing note and vitals Reviewed previous: labs and ECG Interpretation: labs, ECG and x-ray   Results for orders placed or performed during the hospital encounter of 07/16/16  Basic metabolic panel  Result Value Ref Range   Sodium 138 135 - 145 mmol/L   Potassium 3.5 3.5 - 5.1 mmol/L   Chloride 101 101 - 111 mmol/L   CO2 29 22 - 32 mmol/L   Glucose, Bld 97 65 - 99 mg/dL   BUN 16 6 - 20 mg/dL   Creatinine, Ser 5.781.01 0.61 - 1.24 mg/dL   Calcium 9.2 8.9 - 46.910.3 mg/dL   GFR calc non Af Amer >60 >60 mL/min   GFR calc Af Amer >60 >60 mL/min   Anion gap 8 5 - 15  Troponin I  Result Value Ref Range   Troponin I <0.03 <0.03 ng/mL  CBC with Differential  Result Value Ref Range   WBC 6.9 4.0 - 10.5 K/uL   RBC 4.18 (L) 4.22 - 5.81 MIL/uL    Hemoglobin 13.6 13.0 - 17.0 g/dL   HCT 62.938.8 (L) 52.839.0 - 41.352.0 %   MCV 92.8 78.0 - 100.0 fL   MCH 32.5 26.0 - 34.0 pg   MCHC 35.1 30.0 - 36.0 g/dL   RDW 24.412.2 01.011.5 - 27.215.5 %   Platelets 261 150 - 400 K/uL   Neutrophils Relative % 53 %   Neutro Abs 3.7 1.7 - 7.7 K/uL   Lymphocytes Relative 32 %   Lymphs Abs 2.2 0.7 - 4.0 K/uL   Monocytes Relative 14 %   Monocytes Absolute 0.9 0.1 - 1.0 K/uL   Eosinophils Relative 1 %   Eosinophils Absolute 0.1 0.0 - 0.7 K/uL   Basophils Relative 0 %   Basophils Absolute 0.0 0.0 - 0.1 K/uL  D-dimer, quantitative  Result Value Ref Range   D-Dimer, Quant <0.27 0.00 - 0.50 ug/mL-FEU  Troponin I  Result Value Ref Range   Troponin I <0.03 <0.03 ng/mL   Dg Chest 2 View Result Date: 07/16/2016 CLINICAL DATA:  Chest pain. EXAM: CHEST  2 VIEW COMPARISON:  None. FINDINGS: The heart size and mediastinal contours are within normal limits. Both lungs are clear. The visualized skeletal structures are unremarkable. IMPRESSION: No active cardiopulmonary disease. Electronically Signed   By: Gerome Sam III M.D   On: 07/16/2016 17:34    2045:  Doubt PE as cause for symptoms with normal d-dimer and low risk [place].  Doubt ACS as cause for symptoms with normal troponin x2 and unchanged EKG from previous, with TIMI 0/HEART score 0. Pt is using his cellphone, laughing, NAD. Feels improved after meds and wants to go home now. Dx and testing d/w pt.  Questions answered.  Verb understanding, agreeable to d/c home with outpt f/u.     Final Clinical Impressions(s) / ED Diagnoses   Final diagnoses:  None    New Prescriptions New Prescriptions   No medications on file     Samuel Jester, DO 07/20/16 [id number]

## 2016-07-16 NOTE — ED Triage Notes (Signed)
Pt reports chest pain starting today, pt has hx of panic attacks.  Pt reports that pain felt "tight" on the left side of chest and had finger numbness and fatigue with dizziness.  Pt alert and oriented.

## 2016-07-16 NOTE — Discharge Instructions (Signed)
Take over the counter tylenol and ibuprofen, as directed on packaging, as needed for discomfort.  Apply moist heat or ice to the area(s) of discomfort, for 15 minutes at a time, several times per day for the next few days.  Do not fall asleep on a heating or ice pack.  Call your regular medical doctor on Monday to schedule a follow up appointment this week.  Return to the Emergency Department immediately if worsening. ° °

## 2017-01-27 NOTE — Progress Notes (Signed)
[facility] Pulmonary Medicine Consultation      Assessment and Plan:  Chronic cough -Uncertain etiology, this appears to have started with a acute respiratory infection remotely, but continue with the cough. He complains of left-sided lung pain, this is of uncertain etiology. -He is given recommendations to take empiric omeprazole 20 mg once daily, over-the-counter antihistamine once daily for 1 month, call us back with progress. If he is not improved over one month's time with his impaired treatment. We'll check a CT of the chest without contrast. -We'll check an Ace level  Chronic bronchitis. -With chronic expectoration of mucus production. -We'll screen for cystic fibrosis with sweat chloride test.  Asthma. -It is possible this could be causing, or contributing to the patient's cough. He is asked to continue on Brio once daily. -Patient has known allergies, and has been recommended to start on allergy shots in the past, this may be something we need to readdress going forward. -He is asked to keep pets out of the bedroom.  Date: 01/27/2017  MRN# [id number] Logan Juarez 1990-08-04  Referring Physician: Dr. Gillis Logan Juarez is a 26 y.o. old male seen in consultation for chief complaint of:    Chief Complaint  Patient presents with  . Advice Only    Referred by Dr. Burnett Sheng  . Cough    Constant nasal congestion and cough with bad smell thru nostrils x 1 year.  . Nasal Congestion    Pt has been evaluated by ENT.    HPI:   The patient is a 26 year old male with a history of allergic rhinitis, referred with chronic cough.His current medications include Brio 200, Rhinocort nasal spray. The cough has been present for 3 years, it started as a bronchitis, and the cough never went away. It feels that there is a very specific part in the left lung that he has to cough up stuff. He feels that there is an irritation in some part of his lung, and when he coughs up stuff that it will smell  strange.   He has been tested for allergies and was allergic to dust, maple, pollens. He has taken allergy shots in middle school. He was suggested to do them again, but he has not yet decided.  The cough is made worse by humidity, and being outside. He has no symptoms of reflux or heartburn.   He works as a Comptroller, in an office.   He has a dog, and in the bedroom, no allergies. He takes no antihistamine. He last smoked about 1.5 yrs ago for 5 years, usually about 3 cigs per day. He has been diagnosed with asthma in the past but never really bothered him. He had dyspnea with exertion during sports as a child.   I personally reviewed. Chest x-ray imaging, chest x-ray 07/16/2016; mild scoliosis, otherwise unremarkable.  PMHX:   Past Medical History:  Diagnosis Date  . ADD (attention deficit disorder)   . Anxiety   . Chronic chest pain   . Depression   . Hair loss   . Panic attack    Surgical Hx:  History reviewed. No pertinent surgical history. Family Hx:  Family History  Problem Relation Age of Onset  . Asthma Father   . Hyperlipidemia Father   . Lung cancer Maternal Grandmother    Social Hx:   Social History  Substance Use Topics  . Smoking status: Former Smoker    Years: 2.00  . Smokeless tobacco: Former Neurosurgeon    Types:  Snuff    Quit date: 04/15/2016  . Alcohol use No   Medication:    Current Outpatient Prescriptions:  .  albuterol (PROVENTIL HFA;VENTOLIN HFA) 108 (90 Base) MCG/ACT inhaler, Inhale 2 puffs into the lungs every 6 (six) hours as needed., Disp: , Rfl:  .  BREO ELLIPTA 200-25 MCG/INH AEPB, Inhale 1 puff into the lungs daily., Disp: , Rfl:  .  budesonide (RHINOCORT AQUA) 32 MCG/ACT nasal spray, Place 1 spray into the nose daily., Disp: , Rfl:  .  finasteride (PROSCAR) 5 MG tablet, Take 5 mg by mouth daily. , Disp: , Rfl:  .  meloxicam (MOBIC) 15 MG tablet, Take 15 mg by mouth daily., Disp: , Rfl:  .  montelukast (SINGULAIR) 10 MG tablet, Take 10  mg by mouth at bedtime., Disp: , Rfl:  .  omega-3 acid ethyl esters (LOVAZA) 1 g capsule, Take 1 capsule by mouth daily., Disp: , Rfl:    Allergies:  Patient has no known allergies.  Review of Systems: Gen:  Denies  fever, sweats, chills HEENT: Denies blurred vision, double vision. bleeds, sore throat Cvc:  No dizziness, chest pain. Resp:   Denies cough or sputum production, shortness of breath Gi: Denies swallowing difficulty, stomach pain. Gu:  Denies bladder incontinence, burning urine Ext:   No Joint pain, stiffness. Skin: No skin rash,  hives  Endoc:  No polyuria, polydipsia. Psych: No depression, insomnia. Other:  All other systems were reviewed with the patient and were negative other that what is mentioned in the HPI.   Physical Examination:   VS: BP 114/64 (BP Location: Left Arm, Cuff Size: Normal)   Pulse 79   Resp 16   Ht 5\' 9"  (1.753 m)   Wt 135 lb (61.2 kg)   SpO2 98%   BMI 19.94 kg/m   General Appearance: No distress  Neuro:without focal findings,  speech normal,  HEENT: PERRLA, EOM intact.   Pulmonary: normal breath sounds, No wheezing.  CardiovascularNormal S1,S2.  No m/r/g.   Abdomen: Benign, Soft, non-tender. Renal:  No costovertebral tenderness  GU:  No performed at this time. Endoc: No evident thyromegaly, no signs of acromegaly. Skin:   warm, no rashes, no ecchymosis  Extremities: normal, no cyanosis, clubbing.  Other findings:    LABORATORY PANEL:   CBC No results for input(s): WBC, HGB, HCT, PLT in the last 168 hours. ------------------------------------------------------------------------------------------------------------------  Chemistries  No results for input(s): NA, K, CL, CO2, GLUCOSE, BUN, CREATININE, CALCIUM, MG, AST, ALT, ALKPHOS, BILITOT in the last 168 hours.  Invalid input(s): GFRCGP ------------------------------------------------------------------------------------------------------------------  Cardiac Enzymes No  results for input(s): TROPONINI in the last 168 hours. ------------------------------------------------------------  RADIOLOGY:  No results found.     Thank  you for the consultation and for allowing [facility], [facility] to assist in the care of your patient. Our recommendations are noted above.  Please contact us if we can be of further service.   [place]s Guiles, MD.  Board Certified in Internal Medicine, Pulmonary Medicine, Critical Care Medicine, and Sleep Medicine.   Pulmonary and Critical Care Office Number: [phone]  Santiago Glad, M.D.  Billy Fischer, M.D  01/27/2017

## 2017-01-30 ENCOUNTER — Encounter: Payer: Self-pay | Admitting: Internal Medicine

## 2017-01-30 ENCOUNTER — Ambulatory Visit (INDEPENDENT_AMBULATORY_CARE_PROVIDER_SITE_OTHER): Payer: Managed Care, Other (non HMO) | Admitting: Internal Medicine

## 2017-01-30 ENCOUNTER — Other Ambulatory Visit
Admission: RE | Admit: 2017-01-30 | Discharge: 2017-01-30 | Disposition: A | Discharge status: Home / Self Care | Payer: Managed Care, Other (non HMO) | Source: Ambulatory Visit | Attending: Internal Medicine | Admitting: Internal Medicine

## 2017-01-30 VITALS — BP 114/64 | HR 79 | Resp 16 | Ht 69.0 in | Wt 135.0 lb

## 2017-01-30 DIAGNOSIS — J42 Unspecified chronic bronchitis: Secondary | ICD-10-CM

## 2017-01-30 DIAGNOSIS — R059 Cough, unspecified: Secondary | ICD-10-CM

## 2017-01-30 DIAGNOSIS — R05 Cough: Secondary | ICD-10-CM

## 2017-01-30 NOTE — Patient Instructions (Signed)
--  Start omeprazole 20 mg daily over the counter for 1 month. If cough continues then stop.   --Start claritin or allegra daily for one month. If cough continues then stop.   --We will check some routine testing to look for other causes of cough.   --Call us back in 1 month with progress, if no improvement will check a CT chest.

## 2017-01-30 NOTE — Addendum Note (Signed)
Addended by: Shane Crutch on: 01/30/2017 09:33 AM   Modules accepted: Orders

## 2017-03-13 ENCOUNTER — Other Ambulatory Visit: Payer: Self-pay | Admitting: Family Medicine

## 2017-03-13 DIAGNOSIS — R1084 Generalized abdominal pain: Secondary | ICD-10-CM

## 2017-03-16 ENCOUNTER — Ambulatory Visit
Admission: RE | Admit: 2017-03-16 | Discharge: 2017-03-16 | Disposition: A | Discharge status: Home / Self Care | Payer: Managed Care, Other (non HMO) | Source: Ambulatory Visit | Attending: Family Medicine | Admitting: Family Medicine

## 2017-03-16 DIAGNOSIS — R1084 Generalized abdominal pain: Secondary | ICD-10-CM | POA: Diagnosis present

## 2017-07-10 NOTE — Progress Notes (Signed)
[facility] Consultation      Assessment and Plan:  Chronic cough with chronic bronchitis, and sensation of abnormality in left lung which could be impacted mucus. -Patient has not yet done the testing ordered at previous visit.  I would like him to try a trial of omeprazole, Mucinex-D, and check an ACE level, sweat chloride test, sputum culture..    Chronic bronchitis. -Continue Brio. -He is asked to call Logan Juarez back when she is completed a one-month trial of the above medications, and I will order testing has been completed.  If he has not noticed any improvement, we will consider CT of the chest and/or bronchoscopy.  Asthma. -Patient has known allergies, and has been recommended to start on allergy shots in the past, this may be something we need to readdress going forward. -He is asked to keep pets out of the bedroom.  Date: 07/10/2017  MRN# [id number] Logan Juarez 12-04-1990  Referring Physician: Dr. Gillis [name] is a 27 y.o. old male seen in consultation for chief complaint of:    Chief Complaint  Patient presents with  . Follow-up    prod cough w/clear mucus;     HPI:   The patient is a 27 year old male last seen with symptoms of chronic bronchitis, chronic cough, possible asthma.  Last visit he was asked to start an over-the-counter antihistamine, he was sent for an ACE level, sweat chloride test, asked to continue Brio. He was asked to try omeoprazole but has not yet started taking this.  He has not had the blood test. He continue to have the cough but it is clear. He currently takes no medications for cough, and he continues to have a sensation of tightness in his left lung and his left lung feels irritated in the upper part of the left lung.    He has been tested for allergies and was allergic to dust, maple, pollens. He has taken allergy shots in middle school. He was suggested to do them again, but he has not yet decided.   He works as a Barrister's clerk, in an office.   He has a dog, and in the bedroom, no allergies. He takes no antihistamine. He last smoked about 1.5 yrs ago for 5 years, usually about 3 cigs per day. He has been diagnosed with asthma in the past but never really bothered him. He had dyspnea with exertion during sports as a child.   I personally reviewed. Chest x-ray imaging, chest x-ray 07/16/2016; mild scoliosis, otherwise unremarkable.   Allergies:  Patient has no known allergies.  Review of Systems: Gen:  Denies  fever, sweats, chills HEENT: Denies blurred vision, double vision. bleeds, sore throat Cvc:  No dizziness, chest pain. Resp:   Denies cough or sputum production, shortness of breath Gi: Denies swallowing difficulty, stomach pain. Gu:  Denies bladder incontinence, burning urine Ext:   No Joint pain, stiffness. Skin: No skin rash,  hives  Endoc:  No polyuria, polydipsia. Psych: No depression, insomnia. Other:  All other systems were reviewed with the patient and were negative other that what is mentioned in the HPI.   Physical Examination:   VS: BP (!) 98/54 (BP Location: Left Arm, Cuff Size: Normal)   Pulse 85   Ht 5\' 9"  (1.753 m)   Wt 137 lb (62.1 kg)   SpO2 98%   BMI 20.23 kg/m   General Appearance: No distress  Neuro:without focal findings,  speech normal,  HEENT: PERRLA, EOM intact.  Pulmonary: normal breath sounds, No wheezing.  CardiovascularNormal S1,S2.  No m/r/g.   Abdomen: Benign, Soft, non-tender. Renal:  No costovertebral tenderness  GU:  No performed at this time. Endoc: No evident thyromegaly, no signs of acromegaly. Skin:   warm, no rashes, no ecchymosis  Extremities: normal, no cyanosis, clubbing.  Other findings:    LABORATORY PANEL:   CBC No results for input(s): WBC, HGB, HCT, PLT in the last 168 hours. ------------------------------------------------------------------------------------------------------------------  Chemistries  No results for input(s):  NA, K, CL, CO2, GLUCOSE, BUN, CREATININE, CALCIUM, MG, AST, ALT, ALKPHOS, BILITOT in the last 168 hours.  Invalid input(s): GFRCGP ------------------------------------------------------------------------------------------------------------------  Cardiac Enzymes No results for input(s): TROPONINI in the last 168 hours. ------------------------------------------------------------  RADIOLOGY:  No results found.     Thank  you for the consultation and for allowing [facility] to assist in the care of your patient. Our recommendations are noted above.  Please contact us if we can be of further service.   Wells Guileseep Aly Hauser, MD.  Board Certified in Internal Medicine, Pulmonary Medicine, Critical Care Medicine, and Sleep Medicine.  [facility] Office Number: [phone]  Santiago Gladavid Kasa, M.D.  Billy Fischeravid Simonds, M.D  07/10/2017

## 2017-07-11 ENCOUNTER — Other Ambulatory Visit
Admission: RE | Admit: 2017-07-11 | Discharge: 2017-07-11 | Disposition: A | Discharge status: Home / Self Care | Payer: Managed Care, Other (non HMO) | Source: Ambulatory Visit | Attending: Internal Medicine | Admitting: Internal Medicine

## 2017-07-11 ENCOUNTER — Ambulatory Visit (INDEPENDENT_AMBULATORY_CARE_PROVIDER_SITE_OTHER): Payer: Managed Care, Other (non HMO) | Admitting: Internal Medicine

## 2017-07-11 ENCOUNTER — Encounter: Payer: Self-pay | Admitting: Internal Medicine

## 2017-07-11 VITALS — BP 98/54 | HR 85 | Ht 69.0 in | Wt 137.0 lb

## 2017-07-11 DIAGNOSIS — J42 Unspecified chronic bronchitis: Secondary | ICD-10-CM

## 2017-07-11 DIAGNOSIS — R05 Cough: Secondary | ICD-10-CM | POA: Diagnosis present

## 2017-07-11 NOTE — Patient Instructions (Addendum)
Start omeprazole 20 mg once daily.  Will check sputum culture.  Start Mucinex-D once daily for 30 days.  Perform lab tests previously ordered.   Once you have had the blood test, sputum test, and used above mediations call us back.

## 2017-07-12 LAB — ANGIOTENSIN CONVERTING ENZYME: Angiotensin-Converting Enzyme: 21 U/L (ref 14–82)

## 2017-07-18 LAB — CYSTIC FIBROSIS MUTATION 97: Interpretation: NOT DETECTED

## 2018-02-05 NOTE — Progress Notes (Addendum)
[facility]     Assessment and Plan:  Chronic cough with chronic bronchitis, and sensation of abnormality in left lung which could be impacted mucus.  Cannot rule out bronchiectasis. -No improvement with empiric trial of omeprazole, Mucinex-D. Normal  ACE level, sweat chloride test. - Continues to have abnormal sensation of left lung with feeling of impacted mucus.  Discussed possible CT scan as well as bronchoscopy, discussed that if all testing is negative the likely yield a bronchoscopy would be low but we can consider this if he would like to proceed.  We will first see if we can get a CT chest to see if this will be approved, if normal will stop work-up at that point, unless patient is interested in proceeding with bronchoscopy.  Addendum, insurance declined CT chest, will check CXR.   Chronic bronchitis with possible underlying changes of bronchiectasis.. -Continue Brio. - Recommend use flutter valve 4-5 times per day to help with mucus clearance.. -He continues to be very concerned with his symptoms, will check CT chest.  Asthma. -He is asked to keep pets out of the bedroom.  Date: 02/05/2018  MRN# [id number] Logan Juarez June 13, 1991  Referring Physician: Dr. Gillis [name] is a 27 y.o. old male seen in consultation for chief complaint of:    Chief Complaint  Patient presents with  . Follow-up    f/u testing:   . Cough    prod at times: left lung pain at times    HPI:   The patient is a 27 year old male last seen with symptoms of chronic bronchitis, chronic cough, possible asthma.  Last visit he was asked to start an over-the-counter antihistamine, he was sent for an ACE level, sweat chloride test, sputum culture, asked to continue Brio. He was asked to try omeoprazole. He did not notice a difference with the empiric medications.   He notes congestion on the left side of his face and left lung.    Blood work 07/03/2017>> ACE, cystic fibrosis  mutations, negative. Chest x-ray imaging, chest x-ray 07/16/2016; mild scoliosis, otherwise unremarkable.   Allergies:  Patient has no known allergies.  Review of Systems:  Constitutional: Feels well. Cardiovascular: No chest pain.  Pulmonary: No hemoptysis.  The remainder of systems were reviewed and were found to be negative other than what is documented in the HPI.    Physical Examination:   VS: BP 108/62 (BP Location: Left Arm, Cuff Size: Normal)   Pulse 68   Ht 5\' 9"  (1.753 m)   Wt 136 lb (61.7 kg)   SpO2 100%   BMI 20.08 kg/m   General Appearance: No distress  Neuro:without focal findings, mental status, speech normal, alert and oriented HEENT: PERRLA, EOM intact Pulmonary: No wheezing, No rales  CardiovascularNormal S1,S2.  No m/r/g.  Abdomen: Benign, Soft, non-tender, No masses Renal:  No costovertebral tenderness  GU:  No performed at this time. Endoc: No evident thyromegaly, no signs of acromegaly or Cushing features Skin:   warm, no rashes, no ecchymosis  Extremities: normal, no cyanosis, clubbing.      LABORATORY PANEL:   CBC No results for input(s): WBC, HGB, HCT, PLT in the last 168 hours. ------------------------------------------------------------------------------------------------------------------  Chemistries  No results for input(s): NA, K, CL, CO2, GLUCOSE, BUN, CREATININE, CALCIUM, MG, AST, ALT, ALKPHOS, BILITOT in the last 168 hours.  Invalid input(s): GFRCGP ------------------------------------------------------------------------------------------------------------------  Cardiac Enzymes No results for input(s): TROPONINI in the last 168 hours. ------------------------------------------------------------  RADIOLOGY:  No results found.  Thank  you for the consultation and for allowing [facility] to assist in the care of your patient. Our recommendations are noted above.  Please contact us if we can be of  further service.  Wells Guileseep Renley Banwart, M.D., F.C.C.P.  Board Certified in Internal Medicine, Pulmonary Medicine, Critical Care Medicine, and Sleep Medicine.  [facility] Office Number: [phone]   02/05/2018

## 2018-02-06 ENCOUNTER — Ambulatory Visit: Payer: Managed Care, Other (non HMO) | Admitting: Internal Medicine

## 2018-02-06 ENCOUNTER — Encounter: Payer: Self-pay | Admitting: Internal Medicine

## 2018-02-06 DIAGNOSIS — J471 Bronchiectasis with (acute) exacerbation: Secondary | ICD-10-CM

## 2018-02-06 DIAGNOSIS — R0602 Shortness of breath: Secondary | ICD-10-CM | POA: Diagnosis not present

## 2018-02-06 DIAGNOSIS — R0789 Other chest pain: Secondary | ICD-10-CM

## 2018-02-06 NOTE — Patient Instructions (Signed)
Will send for Ct chest, call us after the test or results. If not approved call us back as needed for followup.

## 2018-02-19 ENCOUNTER — Encounter

## 2018-02-19 ENCOUNTER — Telehealth: Payer: Self-pay | Admitting: Internal Medicine

## 2018-02-19 ENCOUNTER — Ambulatory Visit: Payer: Managed Care, Other (non HMO) | Admitting: Cardiovascular Disease

## 2018-02-19 ENCOUNTER — Encounter: Payer: Self-pay | Admitting: Cardiovascular Disease

## 2018-02-19 VITALS — BP 116/62 | HR 63 | Ht 68.0 in | Wt 134.8 lb

## 2018-02-19 DIAGNOSIS — R0789 Other chest pain: Secondary | ICD-10-CM | POA: Diagnosis not present

## 2018-02-19 DIAGNOSIS — R002 Palpitations: Secondary | ICD-10-CM | POA: Insufficient documentation

## 2018-02-19 DIAGNOSIS — F410 Panic disorder [episodic paroxysmal anxiety] without agoraphobia: Secondary | ICD-10-CM | POA: Diagnosis not present

## 2018-02-19 DIAGNOSIS — F419 Anxiety disorder, unspecified: Secondary | ICD-10-CM

## 2018-02-19 DIAGNOSIS — E782 Mixed hyperlipidemia: Secondary | ICD-10-CM

## 2018-02-19 NOTE — Telephone Encounter (Signed)
Pt contacted and advised that order has been placed for CXR. Pt voiced understanding and advised that he could come to [facility] or the outpatient imaging facility.  Rhonda J Cobb

## 2018-02-19 NOTE — Telephone Encounter (Signed)
Patient's Insurance Counselling psychologist) denied CT Chest due to no recent CXR. Spoke with patient and he is willing to obtain CXR in order to obtain CT Chest.    If physician feels this is needed, Please place order to CXR and advise. Rhonda J Cobb

## 2018-02-19 NOTE — Telephone Encounter (Signed)
cxr ordered, pls advise pt to have it done at his convenience.

## 2018-02-19 NOTE — Progress Notes (Signed)
Cardiology Office Note  Date:  02/19/2018   ID:  Allena Earing, DOB 1991-04-18, MRN [id number]  PCP:  Jerl Mina, MD   Chief Complaint  Patient presents with  . New Patient (Initial Visit)    dizzy spells after Heart Rate is abnormal. Left lung discomfort with this issue. Medications verbally reviewed.     HPI:  Logan Juarez is a 27 year old gentleman with past medical history of Smoking Depression Panic attacks/anxiety Who presents for evaluation of chronic left chest pain/chest congestion, palpitations  Reports having a long history of very rare sporadic palpitations Symptoms lasting a few seconds, sometimes up to 5 sec Describes it is a heart beating Occasion will feel lightheaded with these palpitations Feels Back to normal a few seconds later Little vision problem, then goes away  Reports several years ago had one episode at a bar where he felt heart rate slowing down Almost blacked out  relatively quickly improved back to normal No further episodes since that time  Good exercise tolerance Seen by pulmonary for left side chest pain Chest x-ray and CT scan chest ordered by pulmonary  EKG personally reviewed by myself on todays visit Shows normal sinus rhythm rate 63 bpm no significant ST or T wave changes   PMH:   has a past medical history of ADD (attention deficit disorder), Anxiety, Chronic chest pain, Depression, Hair loss, and Panic attack.  PSH:   History reviewed. No pertinent surgical history.  Current Outpatient Medications  Medication Sig Dispense Refill  . finasteride (PROSCAR) 5 MG tablet Take 5 mg by mouth daily.     Marland Kitchen albuterol (PROVENTIL HFA;VENTOLIN HFA) 108 (90 Base) MCG/ACT inhaler Inhale 2 puffs into the lungs every 6 (six) hours as needed.    Marland Kitchen BREO ELLIPTA 200-25 MCG/INH AEPB Inhale 1 puff into the lungs daily.    . budesonide (RHINOCORT AQUA) 32 MCG/ACT nasal spray Place 1 spray into the nose daily.    . cetirizine (ZYRTEC) 10 MG tablet Take  by mouth.    . montelukast (SINGULAIR) 10 MG tablet Take 10 mg by mouth at bedtime.    Marland Kitchen omega-3 acid ethyl esters (LOVAZA) 1 g capsule Take 1 capsule by mouth daily.     No current facility-administered medications for this visit.      Allergies:   Patient has no known allergies.   Social History:  The patient  reports that he has quit smoking. He quit after 2.00 years of use. He quit smokeless tobacco use about 22 months ago.  His smokeless tobacco use included snuff. He reports that he does not drink alcohol or use drugs.   Family History:   family history includes Asthma in his father; Hyperlipidemia in his father; Lung cancer in his maternal grandmother.    Review of Systems: Review of Systems  Constitutional: Negative.   Respiratory: Negative.   Cardiovascular: Positive for palpitations.       Left-sided chest pain  Gastrointestinal: Negative.   Musculoskeletal: Negative.   Neurological: Negative.   Psychiatric/Behavioral: Negative.   All other systems reviewed and are negative.    PHYSICAL EXAM: VS:  BP 116/62 (BP Location: Right Arm, Patient Position: Sitting, Cuff Size: Normal)   Pulse 63   Ht 5\' 8"  (1.727 m)   Wt 134 lb 12 oz (61.1 kg)   BMI 20.49 kg/m  , BMI Body mass index is 20.49 kg/m. GEN: Well nourished, well developed, in no acute distress  HEENT: normal  Neck: no JVD, carotid bruits,  or masses Cardiac: RRR; no murmurs, rubs, or gallops,no edema  Respiratory:  clear to auscultation bilaterally, normal work of breathing GI: soft, nontender, nondistended, + BS MS: no deformity or atrophy  Skin: warm and dry, no rash Neuro:  Strength and sensation are intact Psych: euthymic mood, full affect    Recent Labs: No results found for requested labs within last 8760 hours.    Lipid Panel No results found for: CHOL, HDL, LDLCALC, TRIG    Wt Readings from Last 3 Encounters:  02/19/18 134 lb 12 oz (61.1 kg)  02/06/18 136 lb (61.7 kg)  07/11/17 137 lb  (62.1 kg)       ASSESSMENT AND PLAN:  Palpitations Rare episodes lasting several seconds at a time, one episode every several months Unable to exclude APCs or PVCs Even other arrhythmia We did discuss wearing a monitor but given that symptoms are very rare, will hold off at this time Poor candidate for beta-blockers given low blood pressure at baseline  Chest tightness Atypical in nature Seen by pulmonary, need for chest x-ray and even CT scan chest Given normal clinical exam and EKG less likely cardiac Echocardiogram could be performed if symptoms get worse  Anxiety Managed by primary care  Panic attacks Notes indicating prior panic attacks  Mixed hyperlipidemia Previous numbers elevated in 2017 Recommended he have repeat lab work done with primary care Likely will not need to add a statin but he does report family history  Disposition:   F/U as needed  No orders of the defined types were placed in this encounter.    Signed, Dossie Arbour, M.D., Ph.D. 02/19/2018  [facility] [place], [place] [phone]

## 2018-02-19 NOTE — Addendum Note (Signed)
Addended by: Shane Crutch on: 02/19/2018 11:27 AM   Modules accepted: Orders

## 2018-02-19 NOTE — Patient Instructions (Signed)

## 2018-02-22 ENCOUNTER — Ambulatory Visit
Admission: RE | Admit: 2018-02-22 | Discharge: 2018-02-22 | Disposition: A | Discharge status: Home / Self Care | Payer: Managed Care, Other (non HMO) | Source: Ambulatory Visit | Attending: Internal Medicine | Admitting: Internal Medicine

## 2018-02-22 DIAGNOSIS — J479 Bronchiectasis, uncomplicated: Secondary | ICD-10-CM | POA: Insufficient documentation

## 2018-02-22 DIAGNOSIS — R0602 Shortness of breath: Secondary | ICD-10-CM | POA: Diagnosis not present

## 2018-02-22 DIAGNOSIS — J471 Bronchiectasis with (acute) exacerbation: Secondary | ICD-10-CM

## 2018-02-23 NOTE — Telephone Encounter (Signed)
Contacted patient and advised that I contacted [employer] and advised that patient had CXR and to please reconsider CT Chest.  Information faxed back to [employer] at [phone] with note that CXR has been done on patient to please reconsider CT Chest.  Waiting to hear on decision from [employer]. Pt aware that once I hear back, that I will contact him with decision. Rhonda J Cobb

## 2018-02-23 NOTE — Addendum Note (Signed)
Addended by: Aurelio JewWOODWARD, KATHY M on: 02/23/2018 07:23 AM   Modules accepted: Orders

## 2018-02-27 NOTE — Telephone Encounter (Signed)
[employer] Approved CT Chest without contrast.  Authorization # [id number] Valid from 02/07/28 to 05/07/18.    Called and LMOVM for pt to return my call to schedule. Rhonda J Cobb

## 2018-02-28 NOTE — Telephone Encounter (Signed)
Called and spoke with patient and CT Chest [facility]  Pt is to arrive at 3:45.  Pt is aware of appointment date, arrival time, location and address. Rhonda J Cobb

## 2018-03-07 ENCOUNTER — Ambulatory Visit
Admission: RE | Admit: 2018-03-07 | Discharge: 2018-03-07 | Disposition: A | Discharge status: Home / Self Care | Payer: Managed Care, Other (non HMO) | Source: Ambulatory Visit | Attending: Internal Medicine | Admitting: Internal Medicine

## 2018-03-07 DIAGNOSIS — R911 Solitary pulmonary nodule: Secondary | ICD-10-CM | POA: Insufficient documentation

## 2018-03-07 DIAGNOSIS — R0789 Other chest pain: Secondary | ICD-10-CM | POA: Insufficient documentation

## 2018-03-07 DIAGNOSIS — J471 Bronchiectasis with (acute) exacerbation: Secondary | ICD-10-CM | POA: Insufficient documentation

## 2018-03-07 DIAGNOSIS — R0602 Shortness of breath: Secondary | ICD-10-CM | POA: Diagnosis not present

## 2018-04-11 ENCOUNTER — Other Ambulatory Visit: Payer: Self-pay | Admitting: Pulmonary Disease

## 2018-04-11 DIAGNOSIS — R059 Cough, unspecified: Secondary | ICD-10-CM

## 2018-04-11 DIAGNOSIS — R05 Cough: Secondary | ICD-10-CM

## 2018-04-16 ENCOUNTER — Ambulatory Visit: Payer: Self-pay | Admitting: Internal Medicine

## 2018-04-17 ENCOUNTER — Ambulatory Visit: Payer: Self-pay | Admitting: Cardiovascular Disease

## 2018-04-24 ENCOUNTER — Ambulatory Visit: Payer: Managed Care, Other (non HMO) | Attending: Pulmonary Disease

## 2018-04-24 DIAGNOSIS — R05 Cough: Secondary | ICD-10-CM | POA: Diagnosis present

## 2018-04-24 DIAGNOSIS — R059 Cough, unspecified: Secondary | ICD-10-CM

## 2018-04-24 MED ORDER — METHACHOLINE 16 MG/ML NEB SOLN
2.0000 mL | Freq: Once | RESPIRATORY_TRACT | Status: AC
  Administered 2018-04-24: 32 mg via RESPIRATORY_TRACT
  Filled 2018-04-24: qty 2

## 2018-04-24 MED ORDER — METHACHOLINE 0.25 MG/ML NEB SOLN
2.0000 mL | Freq: Once | RESPIRATORY_TRACT | Status: AC
  Administered 2018-04-24: 0.5 mg via RESPIRATORY_TRACT
  Filled 2018-04-24: qty 2

## 2018-04-24 MED ORDER — ALBUTEROL SULFATE (2.5 MG/3ML) 0.083% IN NEBU
2.5000 mg | INHALATION_SOLUTION | Freq: Once | RESPIRATORY_TRACT | Status: AC
  Administered 2018-04-24: 2.5 mg via RESPIRATORY_TRACT
  Filled 2018-04-24: qty 3

## 2018-04-24 MED ORDER — METHACHOLINE 0.0625 MG/ML NEB SOLN
2.0000 mL | Freq: Once | RESPIRATORY_TRACT | Status: AC
  Administered 2018-04-24: 0.125 mg via RESPIRATORY_TRACT
  Filled 2018-04-24: qty 2

## 2018-04-24 MED ORDER — METHACHOLINE 1 MG/ML NEB SOLN
2.0000 mL | Freq: Once | RESPIRATORY_TRACT | Status: AC
  Administered 2018-04-24: 2 mg via RESPIRATORY_TRACT
  Filled 2018-04-24: qty 2

## 2018-04-24 MED ORDER — SODIUM CHLORIDE 0.9 % IN NEBU
3.0000 mL | INHALATION_SOLUTION | Freq: Once | RESPIRATORY_TRACT | Status: AC
  Administered 2018-04-24: 3 mL via RESPIRATORY_TRACT

## 2018-04-24 MED ORDER — METHACHOLINE 4 MG/ML NEB SOLN
2.0000 mL | Freq: Once | RESPIRATORY_TRACT | Status: AC
  Administered 2018-04-24: 8 mg via RESPIRATORY_TRACT
  Filled 2018-04-24: qty 2

## 2020-09-10 ENCOUNTER — Encounter (INDEPENDENT_AMBULATORY_CARE_PROVIDER_SITE_OTHER): Payer: Self-pay | Admitting: Otolaryngology

## 2020-09-10 ENCOUNTER — Other Ambulatory Visit: Payer: Self-pay

## 2020-09-10 ENCOUNTER — Ambulatory Visit (INDEPENDENT_AMBULATORY_CARE_PROVIDER_SITE_OTHER): Payer: No Typology Code available for payment source | Admitting: Otolaryngology

## 2020-09-10 VITALS — Temp 97.3°F

## 2020-09-10 DIAGNOSIS — J40 Bronchitis, not specified as acute or chronic: Secondary | ICD-10-CM | POA: Diagnosis not present

## 2020-09-10 DIAGNOSIS — K219 Gastro-esophageal reflux disease without esophagitis: Secondary | ICD-10-CM | POA: Diagnosis not present

## 2020-09-10 DIAGNOSIS — J029 Acute pharyngitis, unspecified: Secondary | ICD-10-CM

## 2020-09-10 NOTE — Progress Notes (Signed)
HPI: Logan Juarez is a 30 y.o. male who presents is referred by his PCP for evaluation of sore throat on the right side.  On discussion with the patient apparently about 4 years ago while he was in school he had some bronchitis and had trouble with his breathing and chest discomfort and was seen by ENT in pulmonary at that time and was felt to be bronchial in nature.  He recovered and did well.  However this past January despite being vaccinated he developed COVID with cough and fever.  He never lost his sense of smell or taste.  The cough gradually got better but he still had some slight discomfort in his throat on the right side and has continued to have a bronchial type cough and slight discomfort in the upper right chest. He has no trouble breathing through his nose and is not blowing much mucus out of his nose.  However the cough is occasionally productive.  He is referred here for further evaluation of the throat.  When he has pain in the throat sometimes it radiates up into the right ear. He does not smoke or drink.  He has had no hoarseness..  Past Medical History:  Diagnosis Date  . ADD (attention deficit disorder)   . Anxiety   . Chronic chest pain   . Depression   . Hair loss   . Panic attack    No past surgical history on file. Social History   Socioeconomic History  . Marital status: Single    Spouse name: Not on file  . Number of children: Not on file  . Years of education: Not on file  . Highest education level: Not on file  Occupational History  . Not on file  Tobacco Use  . Smoking status: Former Smoker    Years: 2.00  . Smokeless tobacco: Former Neurosurgeon    Types: Snuff    Quit date: 04/15/2016  Vaping Use  . Vaping Use: Never used  Substance and Sexual Activity  . Alcohol use: No  . Drug use: No  . Sexual activity: Not Currently  Other Topics Concern  . Not on file  Social History Narrative  . Not on file   Social Determinants of Health   Financial Resource  Strain: Not on file  Food Insecurity: Not on file  Transportation Needs: Not on file  Physical Activity: Not on file  Stress: Not on file  Social Connections: Not on file   Family History  Problem Relation Age of Onset  . Asthma Father   . Hyperlipidemia Father   . Lung cancer Maternal Grandmother    No Known Allergies Prior to Admission medications   Medication Sig Start Date End Date Taking? Authorizing Provider  albuterol (PROVENTIL HFA;VENTOLIN HFA) 108 (90 Base) MCG/ACT inhaler Inhale 2 puffs into the lungs every 6 (six) hours as needed. 01/04/17 01/04/18  [provider]  BREO ELLIPTA 200-25 MCG/INH AEPB Inhale 1 puff into the lungs daily. 01/10/17   [provider]  budesonide (RHINOCORT AQUA) 32 MCG/ACT nasal spray Place 1 spray into the nose daily. 01/04/17 01/04/18  [provider]  cetirizine (ZYRTEC) 10 MG tablet Take by mouth.    [provider]  finasteride (PROSCAR) 5 MG tablet Take 5 mg by mouth daily.  03/24/16   [provider]  montelukast (SINGULAIR) 10 MG tablet Take 10 mg by mouth at bedtime. 01/05/17   [provider]  omega-3 acid ethyl esters (LOVAZA) 1 g capsule  Take 1 capsule by mouth daily. 09/27/16 09/27/17  [provider]     Positive ROS: Otherwise negative  All other systems have been reviewed and were otherwise negative with the exception of those mentioned in the HPI and as above.  Physical Exam: Constitutional: Alert, well-appearing, no acute distress Ears: External ears without lesions or tenderness. Ear canals are clear bilaterally.  Both TMs are clear with no signs of infection. Nasal: External nose without lesions. Septum with mild deformity.. Clear nasal passages otherwise.  Both middle meatus regions are clear with no evidence of infection. Oral: Lips and gums without lesions. Tongue and palate mucosa without lesions. Posterior oropharynx clear.  Tonsils are average size soft and benign  bilaterally.  No evidence of exudate or infection.  Indirect laryngoscopy revealed clear base of tongue vallecula and epiglottis. Fiberoptic laryngoscopy was performed to the right nostril.  The nasopharynx was clear.  The base of tongue vallecula and epiglottis were normal.  Both piriform sinuses were clear.  Vocal cords were clear with normal vocal cord mobility.  Had minimal arytenoid edema but no significant erythema and no purulence and no evidence of neoplasm or infection.  He had perhaps mild edema of the arytenoid mucosa. Neck: No palpable adenopathy or masses.  On examination of the neck he has no palpable adenopathy or tenderness in the neck. Respiratory: Breathing comfortably  Skin: No facial/neck lesions or rash noted.  Laryngoscopy  Date/Time: 09/10/2020 2:52 PM Performed by: Drema Halon, MD Authorized by: Drema Halon, MD   Consent:    Consent obtained:  Verbal   Consent given by:  Patient Procedure details:    Indications: direct visualization of the upper aerodigestive tract     Medication:  Afrin   Instrument: flexible fiberoptic laryngoscope     Scope location: right nare   Sinus:    Right middle meatus: normal     Right nasopharynx: normal   Mouth:    Oropharynx: normal     Base of tongue: normal     Epiglottis: normal   Throat:    Pyriform sinus: normal     True vocal cords: normal   Comments:     On fiberoptic laryngoscopy hypopharynx and larynx was clear with no significant mucosal abnormalities.  He had perhaps mild mucosal edema of the arytenoid mucosa consistent with possible reflux but no significant erythema or infection or neoplasm.    Assessment: Clear upper airway examination with perhaps mild reflux. Symptoms of bronchitis.  Plan: Discussed with him that he has no significant hypopharyngeal or laryngeal abnormalities noted although he may have a little bit of reflux and suggested taking omeprazole 40 mg daily before dinner for 3  to 4 weeks to see if this helps at all. If he continues to have chronic coughing would recommend further follow-up with pulmonary.   Narda Bonds, MD   CC:

## 2020-10-27 ENCOUNTER — Other Ambulatory Visit (HOSPITAL_COMMUNITY): Payer: Self-pay | Admitting: Pulmonary Disease

## 2020-10-27 ENCOUNTER — Other Ambulatory Visit: Payer: Self-pay | Admitting: Pulmonary Disease

## 2020-10-27 DIAGNOSIS — R0789 Other chest pain: Secondary | ICD-10-CM

## 2020-10-27 DIAGNOSIS — R911 Solitary pulmonary nodule: Secondary | ICD-10-CM

## 2021-01-15 ENCOUNTER — Ambulatory Visit
Admission: RE | Admit: 2021-01-15 | Discharge: 2021-01-15 | Disposition: A | Discharge status: Home / Self Care | Payer: PRIVATE HEALTH INSURANCE | Source: Ambulatory Visit | Attending: Pulmonary Disease | Admitting: Pulmonary Disease

## 2021-01-15 ENCOUNTER — Other Ambulatory Visit: Payer: Self-pay

## 2021-01-15 DIAGNOSIS — R0789 Other chest pain: Secondary | ICD-10-CM | POA: Diagnosis not present

## 2021-01-15 DIAGNOSIS — R911 Solitary pulmonary nodule: Secondary | ICD-10-CM | POA: Diagnosis present

## 2022-12-20 ENCOUNTER — Ambulatory Visit
Admission: EM | Admit: 2022-12-20 | Discharge: 2022-12-20 | Disposition: A | Discharge status: Home / Self Care | Payer: BC Managed Care – PPO | Attending: Emergency Medicine | Admitting: Emergency Medicine

## 2022-12-20 DIAGNOSIS — J069 Acute upper respiratory infection, unspecified: Secondary | ICD-10-CM

## 2022-12-20 DIAGNOSIS — Z8709 Personal history of other diseases of the respiratory system: Secondary | ICD-10-CM

## 2022-12-20 MED ORDER — ALBUTEROL SULFATE HFA 108 (90 BASE) MCG/ACT IN AERS: 1.0000 | INHALATION_SPRAY | Freq: Four times a day (QID) | RESPIRATORY_TRACT | 0 refills | Status: AC | PRN

## 2022-12-20 MED ORDER — PREDNISONE 10 MG (21) PO TBPK: ORAL_TABLET | Freq: Every day | ORAL | 0 refills | Status: AC

## 2022-12-20 MED ORDER — BENZONATATE 100 MG PO CAPS: 100.0000 mg | ORAL_CAPSULE | Freq: Three times a day (TID) | ORAL | 0 refills | Status: AC | PRN

## 2022-12-20 NOTE — ED Provider Notes (Signed)
Logan Juarez    CSN: [id number] Arrival date & time: 12/20/22  1121      History   Chief Complaint Chief Complaint  Patient presents with   Cough   Sore Throat    HPI Logan Juarez is a 32 y.o. male.  Patient presents with 3-day history of congestion, postnasal drip, runny nose, sore throat, and cough.  No fever, shortness of breath, wheezing, or other symptoms.  Treating with DayQuil and NyQuil.  He reports history of asthma as a child but has not needed an albuterol inhaler with this illness.  He is not sure if his albuterol inhaler is in date.  The history is provided by the patient and medical records.    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 No pertinent surgical history.     Home Medications    Prior to Admission medications   Medication Sig Start Date End Date Taking? Authorizing Provider  albuterol (VENTOLIN HFA) 108 (90 Base) MCG/ACT inhaler Inhale 1-2 puffs into the lungs every 6 (six) hours as needed. 12/20/22  Yes Mickie Bail, NP  benzonatate (TESSALON) 100 MG capsule Take 1 capsule (100 mg total) by mouth 3 (three) times daily as needed for cough. 12/20/22  Yes Mickie Bail, NP  predniSONE (STERAPRED UNI-PAK 21 TAB) 10 MG (21) TBPK tablet Take by mouth daily. As directed 12/20/22  Yes Mickie Bail, NP  BREO ELLIPTA 200-25 MCG/INH AEPB Inhale 1 puff into the lungs daily. 01/10/17   [provider]  budesonide (RHINOCORT AQUA) 32 MCG/ACT nasal spray Place 1 spray into the nose daily. 01/04/17 01/04/18  [provider]  cetirizine  (ZYRTEC) 10 MG tablet Take by mouth.    [provider]  finasteride (PROSCAR) 5 MG tablet Take 5 mg by mouth daily.  03/24/16   [provider]  montelukast (SINGULAIR) 10 MG tablet Take 10 mg by mouth at bedtime. 01/05/17   [provider]  omega-3 acid ethyl esters (LOVAZA) 1 g capsule Take 1 capsule by mouth daily. 09/27/16 09/27/17  [provider]    Family History Family History  Problem Relation Age of Onset   Asthma Father    Hyperlipidemia Father    Lung cancer Maternal Grandmother     Social History Social History   Tobacco Use   Smoking status: Former    Years: 2    Types: Cigarettes   Smokeless tobacco: Former    Types: Snuff    Quit date: 04/15/2016  Vaping Use   Vaping Use: Never used  Substance Use Topics   Alcohol use: No   Drug use: No     Allergies   Patient has no known allergies.   Review of Systems Review of Systems  Constitutional:  Negative for chills and fever.  HENT:  Positive for congestion, postnasal drip, rhinorrhea and sore throat. Negative for ear pain.   Respiratory:  Positive for cough. Negative for shortness of breath and wheezing.   Cardiovascular:  Negative for chest pain and  palpitations.     Physical Exam Triage Vital Signs ED Triage Vitals  Enc Vitals Group     BP 12/20/22 1144 126/79     Pulse Rate 12/20/22 1141 83     Resp 12/20/22 1141 18     Temp 12/20/22 1141 98.5 F (36.9 C)     Temp src --      SpO2 12/20/22 1141 97 %     Weight --      Height --      Head Circumference --      Peak Flow --      Pain Score 12/20/22 1136 5     Pain Loc --      Pain Edu? --      Excl. in GC? --    No data found.  Updated Vital Signs BP 126/79   Pulse 83   Temp 98.5 F (36.9 C)   Resp 18   SpO2 97%   Visual Acuity Right Eye Distance:   Left Eye Distance:   Bilateral Distance:    Right Eye Near:   Left Eye Near:    Bilateral Near:     Physical Exam Vitals and nursing note  reviewed.  Constitutional:      General: He is not in acute distress.    Appearance: He is well-developed.  HENT:     Right Ear: Tympanic membrane normal.     Left Ear: Tympanic membrane normal.     Nose: Congestion and rhinorrhea present.     Mouth/Throat:     Mouth: Mucous membranes are moist.     Pharynx: Oropharynx is clear.  Cardiovascular:     Rate and Rhythm: Normal rate and regular rhythm.     Heart sounds: Normal heart sounds.  Pulmonary:     Effort: Pulmonary effort is normal. No respiratory distress.     Breath sounds: Normal breath sounds. No wheezing.  Musculoskeletal:     Cervical back: Neck supple.  Skin:    General: Skin is warm and dry.  Neurological:     Mental Status: He is alert.  Psychiatric:        Mood and Affect: Mood normal.        Behavior: Behavior normal.      UC Treatments / Results  Labs (all labs ordered are listed, but only abnormal results are displayed) Labs Reviewed - No data to display  EKG   Radiology No results found.  Procedures Procedures (including critical care time)  Medications Ordered in UC Medications - No data to display  Initial Impression / Assessment and Plan / UC Course  I have reviewed the triage vital signs and the nursing notes.  Pertinent labs & imaging results that were available during my care of the patient were reviewed by me and considered in my medical decision making (see chart for details).    Viral URI, history of asthma.  Afebrile and vital signs are stable.  Lungs are clear at this time.  Treating with albuterol inhaler, prednisone taper, Tessalon Perles.  Education provided on viral respiratory illness.  Instructed patient to follow-up with his PCP if his symptoms are not improving.  He agrees to plan of care.  Final Clinical Impressions(s) / UC Diagnoses   Final diagnoses:  Viral URI  History of asthma     Discharge Instructions      Use the albuterol inhaler and take the prednisone  as directed.  Follow up with your primary care provider if your  symptoms are not improving.        ED Prescriptions     Medication Sig Dispense Auth. Provider   albuterol (VENTOLIN HFA) 108 (90 Base) MCG/ACT inhaler Inhale 1-2 puffs into the lungs every 6 (six) hours as needed. 18 g Mickie Bail, NP   predniSONE (STERAPRED UNI-PAK 21 TAB) 10 MG (21) TBPK tablet Take by mouth daily. As directed 21 tablet Mickie Bail, NP   benzonatate (TESSALON) 100 MG capsule Take 1 capsule (100 mg total) by mouth 3 (three) times daily as needed for cough. 21 capsule Mickie Bail, NP      PDMP not reviewed this encounter.   Mickie Bail, NP 12/20/22 1215

## 2022-12-20 NOTE — ED Triage Notes (Signed)
Patient to Urgent Care with complaints of cough/ sinus pain and pressure, sore throat/ nasal congestion. Reports concerns about a sinus infection. Denies any known fevers.  Symptoms started three days ago. Son sick with same symptoms.   Has been taking Dayquil/ Nyquil.

## 2022-12-20 NOTE — Discharge Instructions (Addendum)
Use the albuterol inhaler and take the prednisone as directed.  Follow up with your primary care provider if your symptoms are not improving.    

## 2023-01-12 IMAGING — CT CT CHEST W/O CM
2 of 4 series · 15 of 36 positions shown, 18 images · non-contrast
Comparison: 03/07/2018

CLINICAL DATA: Follow-up right middle lobe nodule

EXAM:
CT CHEST WITHOUT CONTRAST
TECHNIQUE: Multidetector CT imaging of the chest was performed following the
standard protocol without IV contrast.

[Series 2: chest 2.00 · axial · 0.60mm/px · z∈[-1210,-898]mm · 12 of 186 slices shown, 15 images]
[im 15/186  mediastinal]
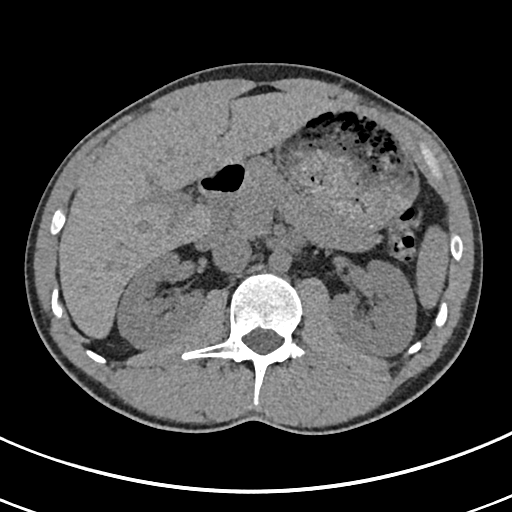
[im 15/186  lung]
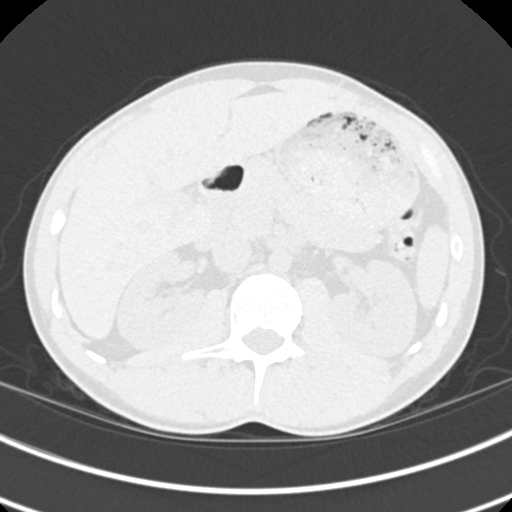
[im 29/186  lung]
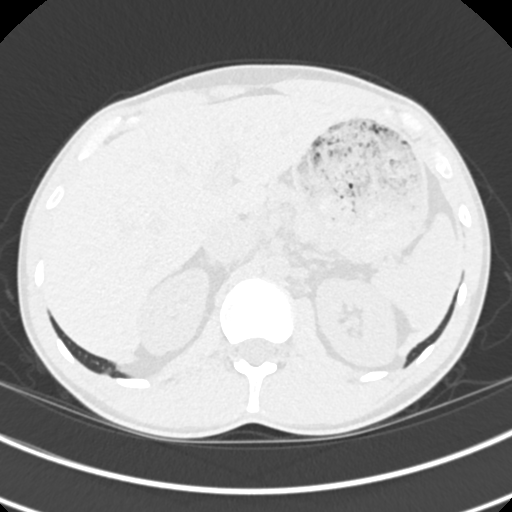
[im 43/186  lung]
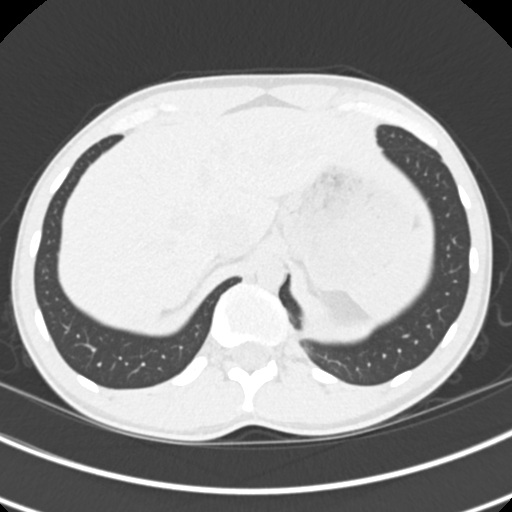
[im 57/186  lung]
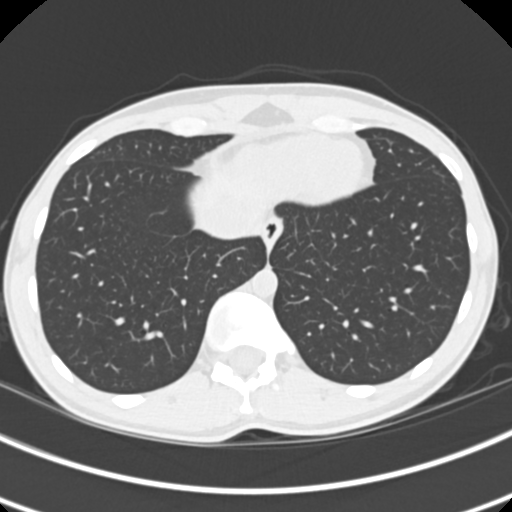
[im 72/186  mediastinal]
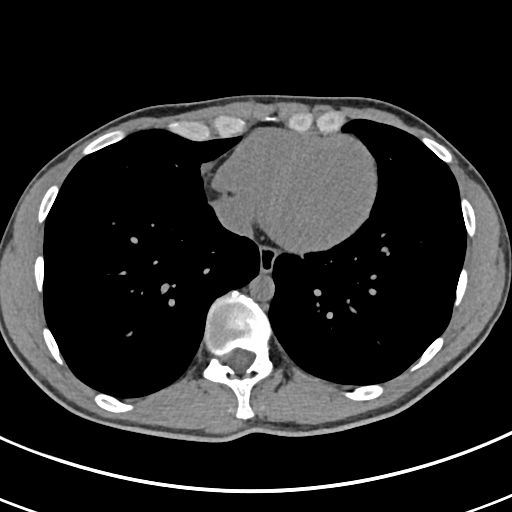
[im 72/186  lung]
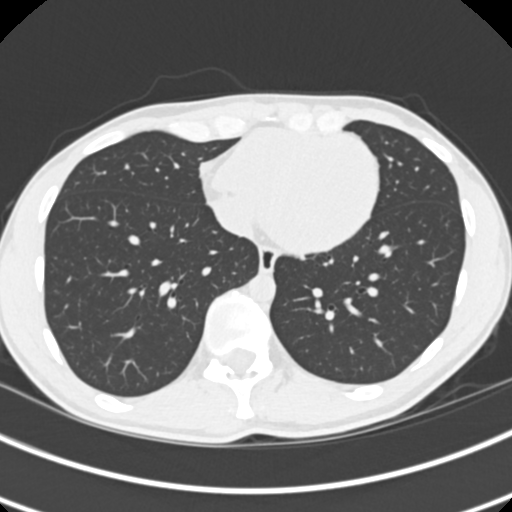
[im 86/186  lung]
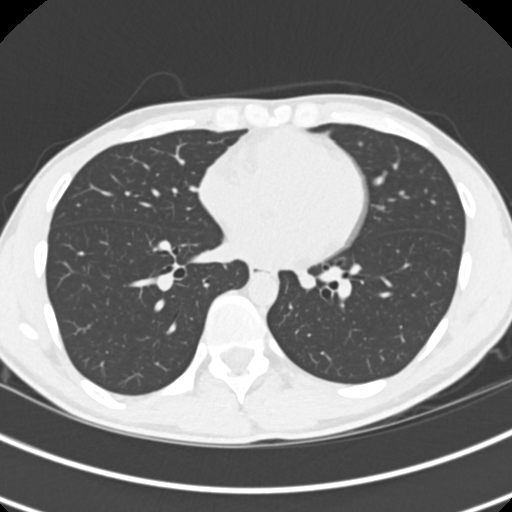
[im 100/186  lung]
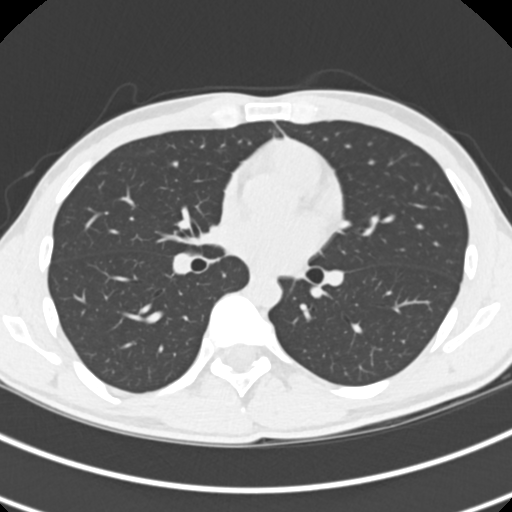
[im 114/186  lung]
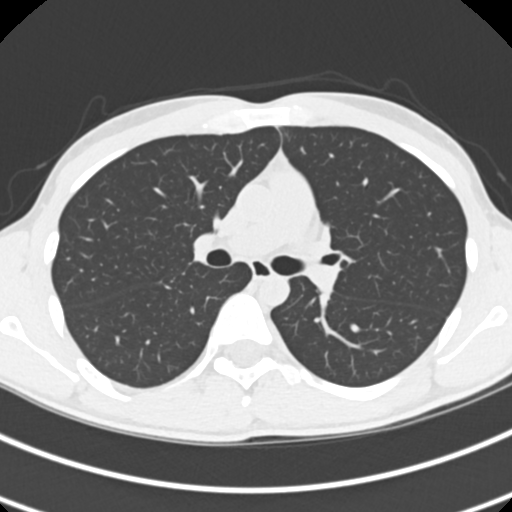
[im 129/186  mediastinal]
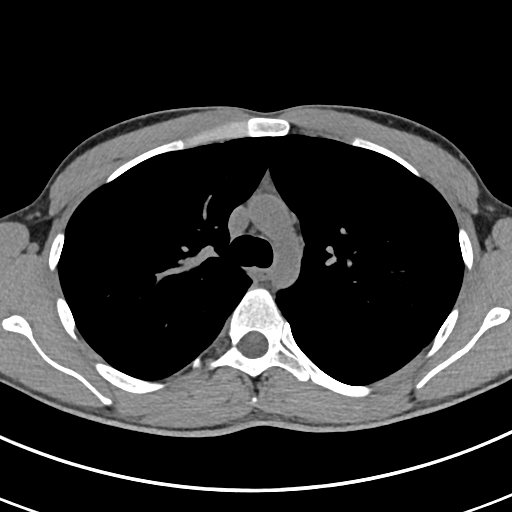
[im 129/186  lung]
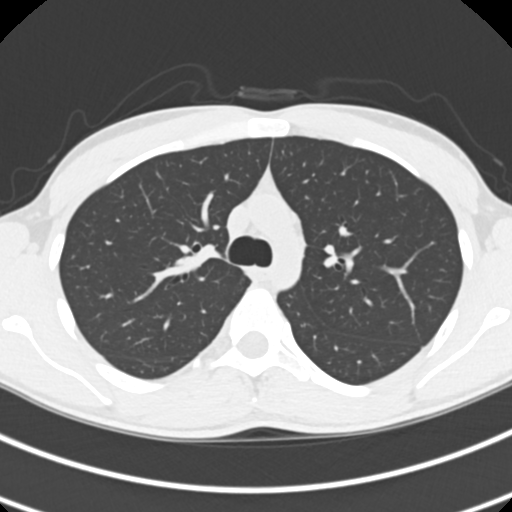
[im 143/186  lung]
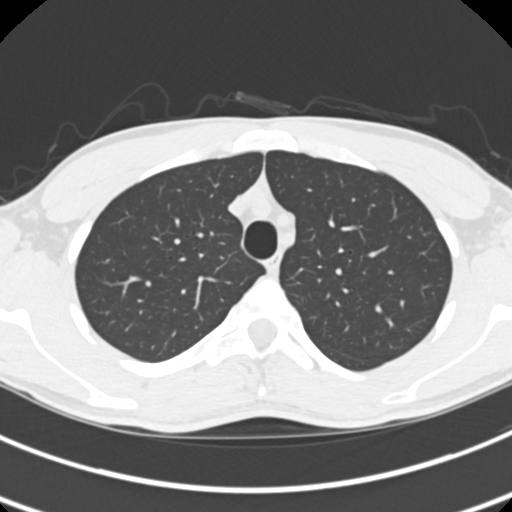
[im 157/186  lung]
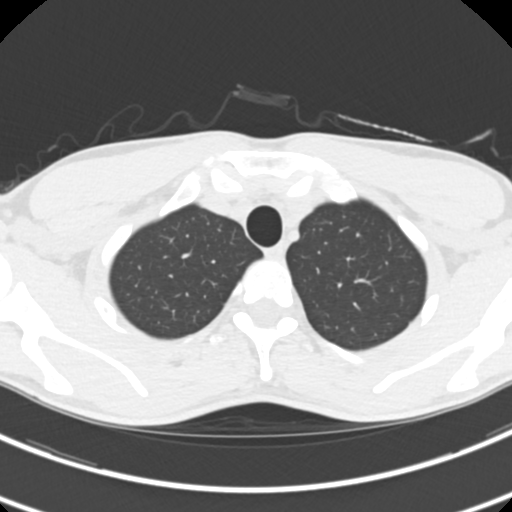
[im 171/186  lung]
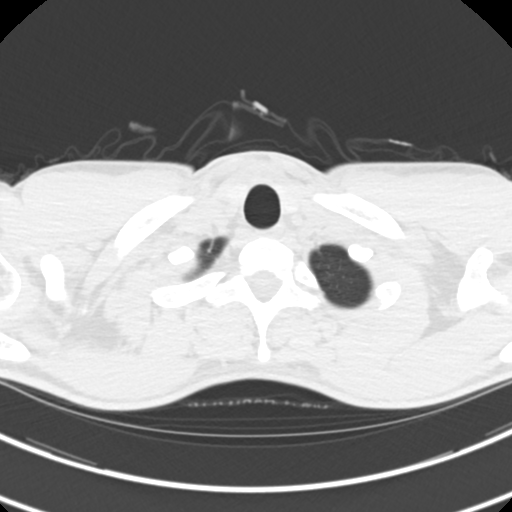

[Series 5: coronals chest 2.00 cor · coronal · 0.60mm/px · 3 of 119 slices shown]
[im 24/119  lung]
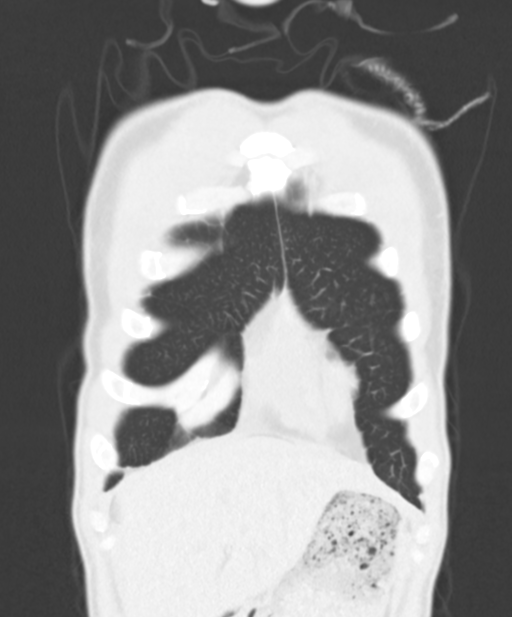
[im 48/119  lung]
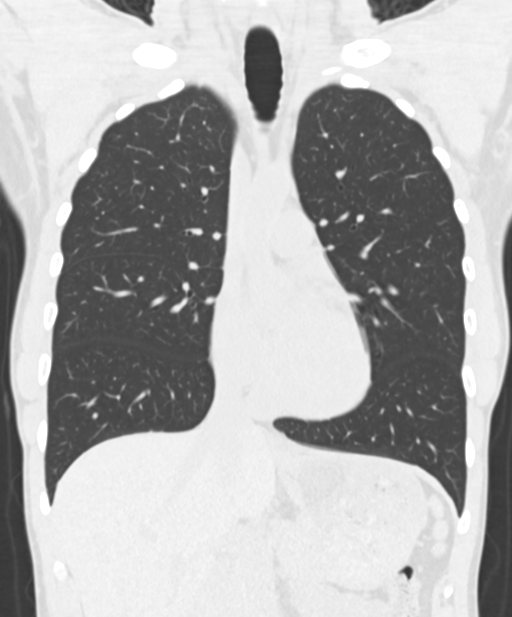
[im 71/119  lung]
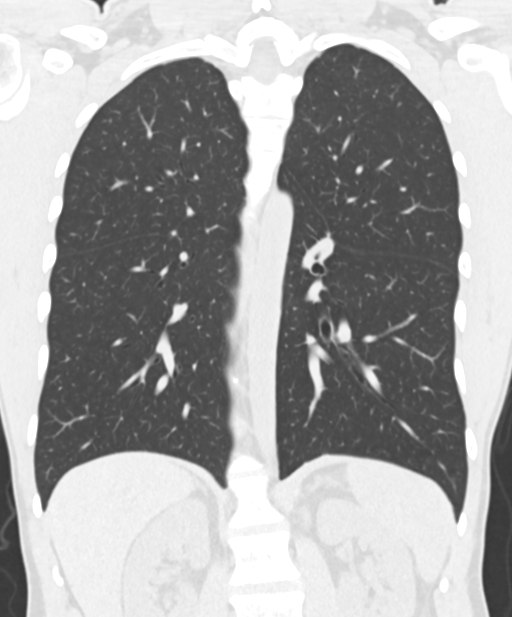

[15 of 36 positions shown; findings below may reference images not displayed]

FINDINGS: Cardiovascular: Heart is normal size. Aorta is normal caliber.

Mediastinum/Nodes: No mediastinal, hilar, or axillary adenopathy.
Trachea and esophagus are unremarkable. Thyroid unremarkable.

Lungs/Pleura: 3 mm right middle lobe pulmonary nodule again noted,
unchanged. This is been stable for just under 2 years, compatible
with a benign nodule. No new or enlarging pulmonary nodules. No
effusions. No confluent airspace opacities.

Upper Abdomen: Imaging into the upper abdomen demonstrates no acute
findings.

Musculoskeletal: Chest wall soft tissues are unremarkable. No acute
bony abnormality.
IMPRESSION: Stable 3 mm right middle lobe pulmonary nodule compatible with
benign nodule.

No acute cardiopulmonary disease.

## 2023-06-05 ENCOUNTER — Other Ambulatory Visit: Payer: Self-pay

## 2023-06-05 ENCOUNTER — Emergency Department
Admission: EM | Admit: 2023-06-05 | Discharge: 2023-06-05 | Disposition: A | Discharge status: Home / Self Care | Payer: BC Managed Care – PPO | Attending: Emergency Medicine | Admitting: Emergency Medicine

## 2023-06-05 ENCOUNTER — Emergency Department: Payer: BC Managed Care – PPO

## 2023-06-05 DIAGNOSIS — K5790 Diverticulosis of intestine, part unspecified, without perforation or abscess without bleeding: Secondary | ICD-10-CM | POA: Diagnosis not present

## 2023-06-05 DIAGNOSIS — R1032 Left lower quadrant pain: Secondary | ICD-10-CM | POA: Diagnosis present

## 2023-06-05 LAB — COMPREHENSIVE METABOLIC PANEL
ALT: 23 U/L (ref 0–44)
AST: 23 U/L (ref 15–41)
Albumin: 4.6 g/dL (ref 3.5–5.0)
Alkaline Phosphatase: 51 U/L (ref 38–126)
Anion gap: 10 (ref 5–15)
BUN: 11 mg/dL (ref 6–20)
CO2: 26 mmol/L (ref 22–32)
Calcium: 9.1 mg/dL (ref 8.9–10.3)
Chloride: 99 mmol/L (ref 98–111)
Creatinine, Ser: 0.85 mg/dL (ref 0.61–1.24)
GFR, Estimated: >60 mL/min (ref >60)
Glucose, Bld: 101 mg/dL — ABNORMAL HIGH (ref 70–99)
Potassium: 3.6 mmol/L (ref 3.5–5.1)
Sodium: 135 mmol/L (ref 135–145)
Total Bilirubin: 1 mg/dL (ref <1.2)
Total Protein: 7.1 g/dL (ref 6.5–8.1)

## 2023-06-05 LAB — URINALYSIS, ROUTINE W REFLEX MICROSCOPIC
Bilirubin Urine: NEGATIVE
Glucose, UA: NEGATIVE mg/dL
Hgb urine dipstick: NEGATIVE
Ketones, ur: NEGATIVE mg/dL
Leukocytes,Ua: NEGATIVE
Nitrite: NEGATIVE
Protein, ur: NEGATIVE mg/dL
Specific Gravity, Urine: 1.008 (ref 1.005–1.030)
pH: 8 (ref 5.0–8.0)

## 2023-06-05 LAB — CBC
HCT: 41 % (ref 39.0–52.0)
Hemoglobin: 14 g/dL (ref 13.0–17.0)
MCH: 31.5 pg (ref 26.0–34.0)
MCHC: 34.1 g/dL (ref 30.0–36.0)
MCV: 92.1 fL (ref 80.0–100.0)
Platelets: 312 10*3/uL (ref 150–400)
RBC: 4.45 MIL/uL (ref 4.22–5.81)
RDW: 11.9 % (ref 11.5–15.5)
WBC: 5.6 10*3/uL (ref 4.0–10.5)
nRBC: 0 % (ref 0.0–0.2)

## 2023-06-05 LAB — LIPASE, BLOOD: Lipase: 27 U/L (ref 11–51)

## 2023-06-05 MED ORDER — AMOXICILLIN-POT CLAVULANATE 875-125 MG PO TABS: 1.0000 | ORAL_TABLET | Freq: Two times a day (BID) | ORAL | 0 refills | Status: AC

## 2023-06-05 MED ORDER — IOHEXOL 300 MG/ML  SOLN
100.0000 mL | Freq: Once | INTRAMUSCULAR | Status: AC | PRN
  Administered 2023-06-05: 100 mL via INTRAVENOUS

## 2023-06-05 MED ORDER — TRAMADOL HCL 50 MG PO TABS: 50.0000 mg | ORAL_TABLET | Freq: Four times a day (QID) | ORAL | 0 refills | Status: AC | PRN

## 2023-06-05 NOTE — ED Provider Notes (Signed)
[facility] Provider Note    Event Date/Time   First MD Initiated Contact with Patient 06/05/23 1048     (approximate)   History   Abdominal Pain   HPI  Logan Juarez is a 32 y.o. male with no significant past medical history who presents with complaints of left lower quadrant pain for nearly a week.  He reports the pain radiates into his pelvis at times.  Denies hematuria.  No nausea or vomiting.  No history of the same.     Physical Exam   Triage Vital Signs: ED Triage Vitals  Encounter Vitals Group     BP 06/05/23 1032 126/62     Systolic BP Percentile --      Diastolic BP Percentile --      Pulse Rate 06/05/23 1030 70     Resp 06/05/23 1032 16     Temp 06/05/23 1030 98.5 F (36.9 C)     Temp src --      SpO2 06/05/23 1030 98 %     Weight 06/05/23 1030 74.8 kg (165 lb)     Height 06/05/23 1030 1.727 m (5\' 8" )     Head Circumference --      Peak Flow --      Pain Score 06/05/23 1029 2     Pain Loc --      Pain Education --      Exclude from Growth Chart --     Most recent vital signs: Vitals:   06/05/23 1030 06/05/23 1032  BP:  126/62  Pulse: 70   Resp:  16  Temp: 98.5 F (36.9 C)   SpO2: 98%      General: Awake, no distress.  CV:  Good peripheral perfusion.  Resp:  Normal effort.  Abd:  No distention.  Mild tenderness left lower quadrant, no CVA tenderness Other:     ED Results / Procedures / Treatments   Labs (all labs ordered are listed, but only abnormal results are displayed) Labs Reviewed  COMPREHENSIVE METABOLIC PANEL - Abnormal; Notable for the following components:      Result Value   Glucose, Bld 101 (*)    All other components within normal limits  URINALYSIS, ROUTINE W REFLEX MICROSCOPIC - Abnormal; Notable for the following components:   Color, Urine STRAW (*)    APPearance CLEAR (*)    All other components within normal limits  LIPASE, BLOOD  CBC     EKG     RADIOLOGY CT abdomen pelvis, viewed  interpret by me, no clear abnormality, pending radiology read    PROCEDURES:  Critical Care performed:   Procedures   MEDICATIONS ORDERED IN ED: Medications  iohexol (OMNIPAQUE) 300 MG/ML solution 100 mL (100 mLs Intravenous Contrast Given 06/05/23 1219)     IMPRESSION / MDM / ASSESSMENT AND PLAN / ED COURSE  I reviewed the triage vital signs and the nursing notes. Patient's presentation is most consistent with acute presentation with potential threat to life or bodily function.  Patient presents with left lower quadrant abdominal pain as detailed above, suspicious for diverticulitis versus colitis versus constipation versus ureterolithiasis  Patient declined analgesics, lab work is quite reassuring, will send for CT abdomen pelvis to evaluate further  CT scan demonstrates diverticulosis, no clear evidence for diverticulitis but given location of pain and consistency will treat for diverticulitis with Augmentin, tramadol, recommend outpatient follow-up, return precautions of worsening, patient agrees to this plan.  No indication for admission at this  time.        FINAL CLINICAL IMPRESSION(S) / ED DIAGNOSES   Final diagnoses:  Diverticulosis     Rx / DC Orders   ED Discharge Orders          Ordered    amoxicillin-clavulanate (AUGMENTIN) 875-125 MG tablet  2 times daily        06/05/23 1313    traMADol (ULTRAM) 50 MG tablet  Every 6 hours PRN        06/05/23 1313             Note:  This document was prepared using Dragon voice recognition software and may include unintentional dictation errors.   Jene Every, MD 06/05/23 1316

## 2023-06-05 NOTE — ED Triage Notes (Signed)
Pt to ED for LLQ pain for a few days. Reports loose stool and nausea. NAD noted. Reports increased urination.

## 2023-11-22 ENCOUNTER — Ambulatory Visit: Payer: Self-pay

## 2023-11-22 DIAGNOSIS — R1032 Left lower quadrant pain: Secondary | ICD-10-CM | POA: Diagnosis present

## 2023-11-22 DIAGNOSIS — K529 Noninfective gastroenteritis and colitis, unspecified: Secondary | ICD-10-CM | POA: Diagnosis not present

## 2023-12-16 ENCOUNTER — Emergency Department

## 2023-12-16 ENCOUNTER — Emergency Department
Admission: EM | Admit: 2023-12-16 | Discharge: 2023-12-16 | Disposition: A | Discharge status: Home / Self Care | Attending: Emergency Medicine | Admitting: Emergency Medicine

## 2023-12-16 DIAGNOSIS — R1031 Right lower quadrant pain: Secondary | ICD-10-CM | POA: Insufficient documentation

## 2023-12-16 LAB — CBC WITH DIFFERENTIAL/PLATELET
Abs Immature Granulocytes: 0.02 K/uL (ref 0.00–0.07)
Basophils Absolute: 0 K/uL (ref 0.0–0.1)
Basophils Relative: 1 %
Eosinophils Absolute: 0.2 K/uL (ref 0.0–0.5)
Eosinophils Relative: 3 %
HCT: 40.1 % (ref 39.0–52.0)
Hemoglobin: 13.4 g/dL (ref 13.0–17.0)
Immature Granulocytes: 0 %
Lymphocytes Relative: 27 %
Lymphs Abs: 1.2 K/uL (ref 0.7–4.0)
MCH: 31.2 pg (ref 26.0–34.0)
MCHC: 33.4 g/dL (ref 30.0–36.0)
MCV: 93.3 fL (ref 80.0–100.0)
Monocytes Absolute: 0.6 K/uL (ref 0.1–1.0)
Monocytes Relative: 12 %
Neutro Abs: 2.6 K/uL (ref 1.7–7.7)
Neutrophils Relative %: 57 %
Platelets: 303 K/uL (ref 150–400)
RBC: 4.3 MIL/uL (ref 4.22–5.81)
RDW: 11.8 % (ref 11.5–15.5)
WBC: 4.6 K/uL (ref 4.0–10.5)
nRBC: 0 % (ref 0.0–0.2)

## 2023-12-16 LAB — COMPREHENSIVE METABOLIC PANEL WITH GFR
ALT: 15 U/L (ref 0–44)
AST: 21 U/L (ref 15–41)
Albumin: 4.1 g/dL (ref 3.5–5.0)
Alkaline Phosphatase: 51 U/L (ref 38–126)
Anion gap: 8 (ref 5–15)
BUN: 14 mg/dL (ref 6–20)
CO2: 26 mmol/L (ref 22–32)
Calcium: 9.1 mg/dL (ref 8.9–10.3)
Chloride: 104 mmol/L (ref 98–111)
Creatinine, Ser: 0.78 mg/dL (ref 0.61–1.24)
GFR, Estimated: >60 mL/min (ref >60)
Glucose, Bld: 98 mg/dL (ref 70–99)
Potassium: 4.1 mmol/L (ref 3.5–5.1)
Sodium: 138 mmol/L (ref 135–145)
Total Bilirubin: 1 mg/dL (ref 0.0–1.2)
Total Protein: 6.4 g/dL — ABNORMAL LOW (ref 6.5–8.1)

## 2023-12-16 LAB — URINALYSIS, ROUTINE W REFLEX MICROSCOPIC
Bilirubin Urine: NEGATIVE
Glucose, UA: NEGATIVE mg/dL
Hgb urine dipstick: NEGATIVE
Ketones, ur: NEGATIVE mg/dL
Leukocytes,Ua: NEGATIVE
Nitrite: NEGATIVE
Protein, ur: NEGATIVE mg/dL
Specific Gravity, Urine: 1.016 (ref 1.005–1.030)
pH: 7 (ref 5.0–8.0)

## 2023-12-16 LAB — LIPASE, BLOOD: Lipase: 32 U/L (ref 11–51)

## 2023-12-16 MED ORDER — DICYCLOMINE HCL 10 MG PO CAPS: 10.0000 mg | ORAL_CAPSULE | Freq: Three times a day (TID) | ORAL | 0 refills | Status: AC | PRN

## 2023-12-16 MED ORDER — IOHEXOL 300 MG/ML  SOLN
100.0000 mL | Freq: Once | INTRAMUSCULAR | Status: AC | PRN
  Administered 2023-12-16: 100 mL via INTRAVENOUS

## 2023-12-16 NOTE — ED Triage Notes (Signed)
 Pt c/o intermittent RLQ abdominal pain and muscle pain w/ urination.  Pain score 6/10.  Pt reports pain increases movement.   Pt has not taken anything for pain.

## 2023-12-16 NOTE — ED Provider Notes (Signed)
 [facility] Provider Note    Event Date/Time   First MD Initiated Contact with Patient 12/16/23 [phone]     (approximate)   History   Abdominal Pain   HPI  Logan Juarez is a 33 y.o. male with history of anxiety, ADD, hyperlipidemia and as listed in EMR presents to the emergency department for treatment and evaluation of intermittent right lower quadrant pain that awakened him around 5am. He got up and went to the bathroom to urinate. Engaging muscles to start urine stream was painful but no dysuria and no noted hematuria. No suspected fever. Normal appetite but has not eaten this morning. No diarrhea or vomiting. Pain is worse with movement. No previous abdominal surgeries.     Physical Exam    Vitals:   12/16/23 0930 12/16/23 0959  BP: 128/67   Pulse: 86   Resp: 16   Temp: 98 F (36.7 C)   SpO2: 100% 100%    General: Awake, no distress.  CV:  Good peripheral perfusion.  Resp:  Normal effort.  Abd:  No distention. Soft. RLQ tenderness with moderate pressure on palpation. No rebound tenderness.  Other:     ED Results / Procedures / Treatments   Labs (all labs ordered are listed, but only abnormal results are displayed)  Labs Reviewed  COMPREHENSIVE METABOLIC PANEL WITH GFR - Abnormal; Notable for the following components:      Result Value   Total Protein 6.4 (*)    All other components within normal limits  URINALYSIS, ROUTINE W REFLEX MICROSCOPIC - Abnormal; Notable for the following components:   Color, Urine YELLOW (*)    APPearance CLEAR (*)    All other components within normal limits  CBC WITH DIFFERENTIAL/PLATELET  LIPASE, BLOOD     EKG  Not indicated.   RADIOLOGY  Image and radiology report reviewed and interpreted by me. Radiology report consistent with the same.  No acute findings on CT abdomen and pelvis with contrast  PROCEDURES:  Critical Care performed: No  Procedures   MEDICATIONS ORDERED IN  ED:  Medications  iohexol  (OMNIPAQUE ) 300 MG/ML solution 100 mL (100 mLs Intravenous Contrast Given 12/16/23 1055)     IMPRESSION / MDM / ASSESSMENT AND PLAN / ED COURSE   I have reviewed the triage note and vital signs. Vital signs are normal.   Differential diagnosis includes, but is not limited to, appendicitis, ureterolithiasis, muscle strain, adenitis.  Patient's presentation is most consistent with acute illness / injury with system symptoms.  33 year old male presenting to the emergency department for treatment and evaluation of right lower quadrant abdominal pain since 5 AM.  See HPI for further details.  On exam, he does have some right lower quadrant pain with moderate pressure. Plan will be to get labs, urinalysis, and CT abdomen and pelvis to rule out appendicitis.  Patient is aware and agreeable to the plan.  Clinical Course as of 12/16/23 1214  Sat Dec 16, 2023  1119 Labs and urinalysis are reassuring.  CT abdomen pelvis results are pending.  Patient updated and reports that his pain is better than when he arrived. [CT]  1214 No acute concerns on CT abdomen and pelvis with contrast.  Results reviewed with the patient.  Plan will be to prescribe Bentyl  and have him follow-up with his primary care provider if not improving over the next few days.  ER return precautions given. [CT]    Clinical Course User Index [CT] Elda Dunkerson, Kirk NOVAK, FNP  FINAL CLINICAL IMPRESSION(S) / ED DIAGNOSES   Final diagnoses:  Right lower quadrant abdominal pain     Rx / DC Orders   ED Discharge Orders          Ordered    dicyclomine  (BENTYL ) 10 MG capsule  Every 8 hours PRN        12/16/23 1212             Note:  This document was prepared using Dragon voice recognition software and may include unintentional dictation errors.   Herlinda Kirk NOVAK, FNP 12/16/23 1215    Arlander Charleston, MD 12/16/23 1257

## 2023-12-16 NOTE — Discharge Instructions (Signed)
 Please call and schedule follow-up appointment with your primary care provider if you are not feeling better by Monday.  Return to the emergency department for symptoms that change or worsen if you are unable to schedule an appointment.
